# Patient Record
Sex: Female | Born: 1965 | Hispanic: No | Marital: Single | Smoking: Current every day smoker
Health system: Southern US, Community
[De-identification: ages and names within clinical notes are randomized; demographics above are authoritative.]

## PROBLEM LIST (undated history)

## (undated) DIAGNOSIS — I10 Essential (primary) hypertension: Secondary | ICD-10-CM

## (undated) DIAGNOSIS — Q249 Congenital malformation of heart, unspecified: Secondary | ICD-10-CM

## (undated) DIAGNOSIS — K2970 Gastritis, unspecified, without bleeding: Secondary | ICD-10-CM

## (undated) DIAGNOSIS — F419 Anxiety disorder, unspecified: Secondary | ICD-10-CM

## (undated) DIAGNOSIS — F32A Depression, unspecified: Secondary | ICD-10-CM

## (undated) DIAGNOSIS — F329 Major depressive disorder, single episode, unspecified: Secondary | ICD-10-CM

## (undated) DIAGNOSIS — Z5189 Encounter for other specified aftercare: Secondary | ICD-10-CM

## (undated) HISTORY — PX: CHOLECYSTECTOMY: SHX55

## (undated) HISTORY — PX: CARDIAC SURGERY: SHX584

---

## 2014-06-27 ENCOUNTER — Emergency Department (HOSPITAL_COMMUNITY): Payer: Self-pay

## 2014-06-27 ENCOUNTER — Emergency Department (HOSPITAL_COMMUNITY)
Admission: EM | Admit: 2014-06-27 | Discharge: 2014-06-28 | Disposition: A | Discharge status: Home / Self Care | Payer: Self-pay | Attending: Emergency Medicine | Admitting: Emergency Medicine

## 2014-06-27 ENCOUNTER — Encounter (HOSPITAL_COMMUNITY): Payer: Self-pay | Admitting: Emergency Medicine

## 2014-06-27 DIAGNOSIS — R531 Weakness: Secondary | ICD-10-CM | POA: Insufficient documentation

## 2014-06-27 DIAGNOSIS — R0789 Other chest pain: Secondary | ICD-10-CM | POA: Insufficient documentation

## 2014-06-27 DIAGNOSIS — Z3202 Encounter for pregnancy test, result negative: Secondary | ICD-10-CM | POA: Insufficient documentation

## 2014-06-27 DIAGNOSIS — R109 Unspecified abdominal pain: Secondary | ICD-10-CM | POA: Insufficient documentation

## 2014-06-27 DIAGNOSIS — R42 Dizziness and giddiness: Secondary | ICD-10-CM | POA: Insufficient documentation

## 2014-06-27 HISTORY — DX: Anxiety disorder, unspecified: F41.9

## 2014-06-27 HISTORY — DX: Gastritis, unspecified, without bleeding: K29.70

## 2014-06-27 HISTORY — DX: Congenital malformation of heart, unspecified: Q24.9

## 2014-06-27 HISTORY — DX: Encounter for other specified aftercare: Z51.89

## 2014-06-27 HISTORY — DX: Depression, unspecified: F32.A

## 2014-06-27 HISTORY — DX: Major depressive disorder, single episode, unspecified: F32.9

## 2014-06-27 HISTORY — DX: Essential (primary) hypertension: I10

## 2014-06-27 LAB — URINALYSIS, ROUTINE W REFLEX MICROSCOPIC
Bilirubin Urine: NEGATIVE
Glucose, UA: NEGATIVE mg/dL
Hgb urine dipstick: NEGATIVE
KETONES UR: NEGATIVE mg/dL
LEUKOCYTES UA: NEGATIVE
NITRITE: NEGATIVE
Protein, ur: NEGATIVE mg/dL
SPECIFIC GRAVITY, URINE: 1.003 — AB (ref 1.005–1.030)
UROBILINOGEN UA: 0.2 mg/dL (ref 0.0–1.0)
pH: 6 (ref 5.0–8.0)

## 2014-06-27 LAB — CBC WITH DIFFERENTIAL/PLATELET
BASOS ABS: 0 10*3/uL (ref 0.0–0.1)
Basophils Relative: 0 % (ref 0–1)
EOS PCT: 3 % (ref 0–5)
Eosinophils Absolute: 0.3 10*3/uL (ref 0.0–0.7)
HCT: 37.2 % (ref 36.0–46.0)
Hemoglobin: 12.2 g/dL (ref 12.0–15.0)
Lymphocytes Relative: 28 % (ref 12–46)
Lymphs Abs: 2.7 10*3/uL (ref 0.7–4.0)
MCH: 33.6 pg (ref 26.0–34.0)
MCHC: 32.8 g/dL (ref 30.0–36.0)
MCV: 102.5 fL — ABNORMAL HIGH (ref 78.0–100.0)
MONO ABS: 0.4 10*3/uL (ref 0.1–1.0)
MONOS PCT: 4 % (ref 3–12)
Neutro Abs: 6.3 10*3/uL (ref 1.7–7.7)
Neutrophils Relative %: 65 % (ref 43–77)
PLATELETS: 234 10*3/uL (ref 150–400)
RBC: 3.63 MIL/uL — ABNORMAL LOW (ref 3.87–5.11)
RDW: 15.8 % — ABNORMAL HIGH (ref 11.5–15.5)
WBC: 9.7 10*3/uL (ref 4.0–10.5)

## 2014-06-27 LAB — COMPREHENSIVE METABOLIC PANEL
ALT: 19 U/L (ref 14–54)
AST: 25 U/L (ref 15–41)
Albumin: 3.5 g/dL (ref 3.5–5.0)
Alkaline Phosphatase: 59 U/L (ref 38–126)
Anion gap: 12 (ref 5–15)
BILIRUBIN TOTAL: 0.2 mg/dL — AB (ref 0.3–1.2)
BUN: 13 mg/dL (ref 6–20)
CO2: 21 mmol/L — AB (ref 22–32)
Calcium: 8.9 mg/dL (ref 8.9–10.3)
Chloride: 108 mmol/L (ref 101–111)
Creatinine, Ser: 0.69 mg/dL (ref 0.44–1.00)
GFR calc Af Amer: >60 mL/min (ref >60)
GFR calc non Af Amer: >60 mL/min (ref >60)
Glucose, Bld: 87 mg/dL (ref 65–99)
Potassium: 3.6 mmol/L (ref 3.5–5.1)
Sodium: 141 mmol/L (ref 135–145)
TOTAL PROTEIN: 6.4 g/dL — AB (ref 6.5–8.1)

## 2014-06-27 LAB — PREGNANCY, URINE: Preg Test, Ur: NEGATIVE

## 2014-06-27 LAB — TROPONIN I: Troponin I: <0.03 ng/mL (ref <0.031)

## 2014-06-27 MED ORDER — SODIUM CHLORIDE 0.9 % IV BOLUS (SEPSIS)
1000.0000 mL | Freq: Once | INTRAVENOUS | Status: AC
  Administered 2014-06-27: 1000 mL via INTRAVENOUS

## 2014-06-27 NOTE — ED Notes (Signed)
Pt arrives from [facility] via [facility], reports 2 episodes of near syncope today, denies LOC, denies fall, denies hitting head.  Pt reports starting lisinopril-HTCZ recently, reports  LLE edema since beginning meds.  Pt also reports new meds of gabapentin and meloxicam.  Pt reports taking "1-2 shots" today before near syncope.  AOx4, resp e/u.

## 2014-06-27 NOTE — ED Notes (Signed)
Patient transported to X-ray 

## 2014-06-27 NOTE — ED Provider Notes (Signed)
CSN: [id number]     Arrival date & time 06/27/14  2038 History   First MD Initiated Contact with Patient 06/27/14 2046     Chief Complaint  Patient presents with  . Near Syncope  . Leg Swelling     (Consider location/radiation/quality/duration/timing/severity/associated sxs/prior Treatment) Patient is a 49 y.o. female presenting with near-syncope. The history is provided by the patient and a significant other. No language interpreter was used.  Near Syncope This is a new problem. The current episode started today. Associated symptoms include abdominal pain. Pertinent negatives include no chills, fever, vomiting or weakness. Associated symptoms comments: Two episodes of lightheadedness and chest pain today lasting about 10 minutes each, separated by 6 hours. Currently asymptomatic. She states she ate breakfast, left the shelter and [place] she had the first episode around noon. The second was around 6:00 pm. No full syncope. She states she was recently started on 3 new medications (HCTZ, gabapentin and Mobic). She has noticed increased lower extremity swelling. No SOB, nausea, vomiting. She has intermittent abdominal pain she refers to as "gastritis" that is a chronic problem. .    No past medical history on file. No past surgical history on file. No family history on file. History  Substance Use Topics  . Smoking status: Not on file  . Smokeless tobacco: Not on file  . Alcohol Use: Not on file   OB History    No data available     Review of Systems  Constitutional: Negative for fever and chills.  HENT: Negative.   Respiratory: Positive for chest tightness. Negative for shortness of breath.   Cardiovascular: Positive for near-syncope.  Gastrointestinal: Positive for abdominal pain. Negative for vomiting and diarrhea.  Musculoskeletal: Negative.   Skin: Negative.   Neurological: Positive for light-headedness. Negative for syncope and weakness.       Allergies  Review of patient's allergies indicates not on file.  Home Medications   Prior to Admission medications   Not on File   BP 106/68 mmHg  Pulse 82  Temp(Src) 98.3 F (36.8 C) (Oral)  Resp 19  Ht 5' 3.5" (1.613 m)  Wt 152 lb (68.947 kg)  BMI 26.50 kg/m2  SpO2 97% Physical Exam  Constitutional: She is oriented to person, place, and time. She appears well-developed and well-nourished. No distress.  HENT:  Head: Normocephalic.  Widespread dental decay.   Neck: Normal range of motion. Neck supple.  Cardiovascular: Normal rate and regular rhythm.   Pulmonary/Chest: Effort normal and breath sounds normal. No respiratory distress.  Abdominal: Soft. Bowel sounds are normal. There is no tenderness. There is no rebound and no guarding.  Musculoskeletal: Normal range of motion. She exhibits no edema.  Neurological: She is alert and oriented to person, place, and time.  Skin: Skin is warm and dry. No rash noted.  Psychiatric: She has a normal mood and affect.    ED Course  Procedures (including critical care time) Labs Review Labs Reviewed - No data to display  Imaging Review No results found.   EKG Interpretation None     Results for orders placed or performed during the hospital encounter of 06/27/14  Comprehensive metabolic panel  Result Value Ref Range   Sodium 141 135 - 145 mmol/L   Potassium 3.6 3.5 - 5.1 mmol/L   Chloride 108 101 - 111 mmol/L   CO2 21 (L) 22 - 32 mmol/L   Glucose, Bld 87 65 - 99 mg/dL   BUN 13  6 - 20 mg/dL   Creatinine, Ser 1.61 0.44 - 1.00 mg/dL   Calcium 8.9 8.9 - 09.6 mg/dL   Total Protein 6.4 (L) 6.5 - 8.1 g/dL   Albumin 3.5 3.5 - 5.0 g/dL   AST 25 15 - 41 U/L   ALT 19 14 - 54 U/L   Alkaline Phosphatase 59 38 - 126 U/L   Total Bilirubin 0.2 (L) 0.3 - 1.2 mg/dL   GFR calc non Af Amer >60 >60 mL/min   GFR calc Af Amer >60 >60 mL/min   Anion gap 12 5 - 15  CBC with Differential  Result Value Ref Range   WBC 9.7 4.0 -  10.5 K/uL   RBC 3.63 (L) 3.87 - 5.11 MIL/uL   Hemoglobin 12.2 12.0 - 15.0 g/dL   HCT 04.5 40.9 - 81.1 %   MCV 102.5 (H) 78.0 - 100.0 fL   MCH 33.6 26.0 - 34.0 pg   MCHC 32.8 30.0 - 36.0 g/dL   RDW 91.4 (H) 78.2 - 95.6 %   Platelets 234 150 - 400 K/uL   Neutrophils Relative % 65 43 - 77 %   Neutro Abs 6.3 1.7 - 7.7 K/uL   Lymphocytes Relative 28 12 - 46 %   Lymphs Abs 2.7 0.7 - 4.0 K/uL   Monocytes Relative 4 3 - 12 %   Monocytes Absolute 0.4 0.1 - 1.0 K/uL   Eosinophils Relative 3 0 - 5 %   Eosinophils Absolute 0.3 0.0 - 0.7 K/uL   Basophils Relative 0 0 - 1 %   Basophils Absolute 0.0 0.0 - 0.1 K/uL  Troponin I  Result Value Ref Range   Troponin I <0.03 <0.031 ng/mL  Urinalysis, Routine w reflex microscopic  Result Value Ref Range   Color, Urine YELLOW YELLOW   APPearance CLEAR CLEAR   Specific Gravity, Urine 1.003 (L) 1.005 - 1.030   pH 6.0 5.0 - 8.0   Glucose, UA NEGATIVE NEGATIVE mg/dL   Hgb urine dipstick NEGATIVE NEGATIVE   Bilirubin Urine NEGATIVE NEGATIVE   Ketones, ur NEGATIVE NEGATIVE mg/dL   Protein, ur NEGATIVE NEGATIVE mg/dL   Urobilinogen, UA 0.2 0.0 - 1.0 mg/dL   Nitrite NEGATIVE NEGATIVE   Leukocytes, UA NEGATIVE NEGATIVE  Pregnancy, urine  Result Value Ref Range   Preg Test, Ur NEGATIVE NEGATIVE   Dg Chest 2 View  06/27/2014   CLINICAL DATA:  Weakness and bilateral lower extremity swelling for the past 3 days  EXAM: CHEST  2 VIEW  COMPARISON:  None.  FINDINGS: Normal cardiac silhouette and mediastinal contours. The lungs appear hyperexpanded with diffuse slightly nodular thickening of the pulmonary interstitium no discrete focal airspace opacities. No pleural effusion or pneumothorax. There is minimal pleural parenchymal thickening about the right minor fissure. No evidence of edema. No acute osseous abnormalities. Post cholecystectomy. A surgical clip overlies the medial aspect of the right breast.  IMPRESSION: Mild lung hyperexpansion and bronchitic change  without acute cardiopulmonary disease.   Electronically Signed   By: Simonne Come M.D.   On: 06/27/2014 22:32    MDM   Final diagnoses:  None    1. Near syncope  Labs and imaging are normal. Blood pressure is consistently low throughout ED evaluation. She has been ambulatory without recurrent symptoms and reports she feels well. Advised to stop taking her blood pressure medication (newly prescribed) until seen by the prescribing physician for further evaluation and management.     Elpidio Anis, PA-C 06/28/14 0542  Loraine Leriche  Fayrene FearingJames, MD 07/05/14 1335

## 2014-06-28 MED ORDER — SODIUM CHLORIDE 0.9 % IV BOLUS (SEPSIS)
1000.0000 mL | Freq: Once | INTRAVENOUS | Status: AC
  Administered 2014-06-28: 1000 mL via INTRAVENOUS

## 2014-06-28 NOTE — Discharge Instructions (Signed)
See your doctor this week for recheck. Do not take any more blood pressure medication until re-evaluated by your doctor.

## 2014-07-26 ENCOUNTER — Encounter (HOSPITAL_COMMUNITY): Payer: Self-pay | Admitting: Emergency Medicine

## 2014-07-26 ENCOUNTER — Emergency Department (HOSPITAL_COMMUNITY)
Admission: EM | Admit: 2014-07-26 | Discharge: 2014-07-27 | Disposition: A | Discharge status: Home / Self Care | Payer: Self-pay | Attending: Emergency Medicine | Admitting: Emergency Medicine

## 2014-07-26 ENCOUNTER — Emergency Department (HOSPITAL_COMMUNITY): Payer: Self-pay

## 2014-07-26 DIAGNOSIS — I10 Essential (primary) hypertension: Secondary | ICD-10-CM | POA: Insufficient documentation

## 2014-07-26 DIAGNOSIS — Z791 Long term (current) use of non-steroidal anti-inflammatories (NSAID): Secondary | ICD-10-CM | POA: Insufficient documentation

## 2014-07-26 DIAGNOSIS — F329 Major depressive disorder, single episode, unspecified: Secondary | ICD-10-CM | POA: Insufficient documentation

## 2014-07-26 DIAGNOSIS — R1013 Epigastric pain: Secondary | ICD-10-CM | POA: Insufficient documentation

## 2014-07-26 DIAGNOSIS — F419 Anxiety disorder, unspecified: Secondary | ICD-10-CM | POA: Insufficient documentation

## 2014-07-26 DIAGNOSIS — R569 Unspecified convulsions: Secondary | ICD-10-CM | POA: Insufficient documentation

## 2014-07-26 DIAGNOSIS — Z8719 Personal history of other diseases of the digestive system: Secondary | ICD-10-CM | POA: Insufficient documentation

## 2014-07-26 DIAGNOSIS — Z720 Tobacco use: Secondary | ICD-10-CM | POA: Insufficient documentation

## 2014-07-26 DIAGNOSIS — Z79899 Other long term (current) drug therapy: Secondary | ICD-10-CM | POA: Insufficient documentation

## 2014-07-26 DIAGNOSIS — R4020 Unspecified coma: Secondary | ICD-10-CM

## 2014-07-26 LAB — COMPREHENSIVE METABOLIC PANEL
ALBUMIN: 3.5 g/dL (ref 3.5–5.0)
ALT: 14 U/L (ref 14–54)
AST: 30 U/L (ref 15–41)
Alkaline Phosphatase: 56 U/L (ref 38–126)
Anion gap: 7 (ref 5–15)
BUN: 8 mg/dL (ref 6–20)
CALCIUM: 8 mg/dL — AB (ref 8.9–10.3)
CO2: 23 mmol/L (ref 22–32)
Chloride: 113 mmol/L — ABNORMAL HIGH (ref 101–111)
Creatinine, Ser: 0.74 mg/dL (ref 0.44–1.00)
GFR calc Af Amer: >60 mL/min (ref >60)
Glucose, Bld: 90 mg/dL (ref 65–99)
Potassium: 3.8 mmol/L (ref 3.5–5.1)
SODIUM: 143 mmol/L (ref 135–145)
Total Bilirubin: 0.3 mg/dL (ref 0.3–1.2)
Total Protein: 6.1 g/dL — ABNORMAL LOW (ref 6.5–8.1)

## 2014-07-26 LAB — I-STAT TROPONIN, ED: Troponin i, poc: 0 ng/mL (ref 0.00–0.08)

## 2014-07-26 LAB — CBC WITH DIFFERENTIAL/PLATELET
Basophils Absolute: 0 10*3/uL (ref 0.0–0.1)
Basophils Relative: 0 % (ref 0–1)
Eosinophils Absolute: 0.2 10*3/uL (ref 0.0–0.7)
Eosinophils Relative: 3 % (ref 0–5)
HCT: 35.4 % — ABNORMAL LOW (ref 36.0–46.0)
HEMOGLOBIN: 11.6 g/dL — AB (ref 12.0–15.0)
LYMPHS ABS: 2.3 10*3/uL (ref 0.7–4.0)
Lymphocytes Relative: 30 % (ref 12–46)
MCH: 33 pg (ref 26.0–34.0)
MCHC: 32.8 g/dL (ref 30.0–36.0)
MCV: 100.9 fL — ABNORMAL HIGH (ref 78.0–100.0)
MONOS PCT: 5 % (ref 3–12)
Monocytes Absolute: 0.4 10*3/uL (ref 0.1–1.0)
NEUTROS PCT: 62 % (ref 43–77)
Neutro Abs: 4.7 10*3/uL (ref 1.7–7.7)
Platelets: 263 10*3/uL (ref 150–400)
RBC: 3.51 MIL/uL — AB (ref 3.87–5.11)
RDW: 15.3 % (ref 11.5–15.5)
WBC: 7.6 10*3/uL (ref 4.0–10.5)

## 2014-07-26 LAB — LIPASE, BLOOD: Lipase: 30 U/L (ref 22–51)

## 2014-07-26 NOTE — ED Notes (Signed)
EMS stated that patient reported a syncopal episode. Upon EMS arrival, pt was confused and unable to answer questions appropriately. Enroute to the hospital, pt started to complain of chest pain, that was relieved with nitroglycerin. Upon arrival to the patient's room, patient was CAO, answering questions appropriately, and chest pain free.

## 2014-07-26 NOTE — ED Provider Notes (Signed)
CSN: [id number]     Arrival date & time 07/26/14  2143 History   First MD Initiated Contact with Patient 07/26/14 2200     Chief Complaint  Patient presents with  . Near Syncope     (Consider location/radiation/quality/duration/timing/severity/associated sxs/prior Treatment) Patient is a 49 y.o. female presenting with near-syncope and abdominal pain. The history is provided by the patient and the spouse. No language interpreter was used.  Near Syncope This is a new problem. The current episode started 1 to 2 hours ago. The problem occurs rarely. The problem has been resolved. Associated symptoms include abdominal pain. Pertinent negatives include no chest pain, no headaches and no shortness of breath. Nothing aggravates the symptoms. Nothing relieves the symptoms. She has tried nothing for the symptoms. The treatment provided no relief.  Abdominal Pain Pain location:  Epigastric Pain quality: sharp   Pain radiates to:  Does not radiate Pain severity:  Moderate Onset quality:  Sudden Duration: several hours before syncopal episode. Timing:  Intermittent Progression:  Resolved Chronicity:  New Relieved by:  Nothing Worsened by:  Nothing tried Ineffective treatments:  None tried Associated symptoms: anorexia   Associated symptoms: no chest pain, no chills, no cough, no diarrhea, no dysuria, no fatigue, no fever, no nausea, no shortness of breath, no sore throat and no vomiting   Associated symptoms comment:  Insomnia    Past Medical History  Diagnosis Date  . Hypertension   . Heart abnormality     repiared 1996  . Gastritis   . Blood transfusion without reported diagnosis   . Depression   . Anxiety    Past Surgical History  Procedure Laterality Date  . Cardiac surgery    . Cholecystectomy     History reviewed. No pertinent family history. History  Substance Use Topics  . Smoking status: Current Every Day Smoker -- 1.00 packs/day    Types: Cigarettes  . Smokeless  tobacco: Never Used  . Alcohol Use: Yes     Comment: "special occasion"   OB History    No data available     Review of Systems  Constitutional: Negative for fever, chills, diaphoresis, activity change, appetite change and fatigue.  HENT: Negative for congestion, facial swelling, rhinorrhea and sore throat.   Eyes: Negative for photophobia and discharge.  Respiratory: Negative for cough, chest tightness and shortness of breath.   Cardiovascular: Positive for near-syncope. Negative for chest pain, palpitations and leg swelling.  Gastrointestinal: Positive for abdominal pain and anorexia. Negative for nausea, vomiting and diarrhea.  Endocrine: Negative for polydipsia and polyuria.  Genitourinary: Negative for dysuria, frequency, difficulty urinating and pelvic pain.  Musculoskeletal: Negative for back pain, arthralgias, neck pain and neck stiffness.  Skin: Negative for color change and wound.  Allergic/Immunologic: Negative for immunocompromised state.  Neurological: Positive for syncope. Negative for facial asymmetry, weakness, numbness and headaches.  Hematological: Does not bruise/bleed easily.  Psychiatric/Behavioral: Negative for confusion and agitation.      Allergies  Trazamine  Home Medications   Prior to Admission medications   Medication Sig Start Date End Date Taking? Authorizing Provider  gabapentin (NEURONTIN) 100 MG capsule Take 100 mg by mouth at bedtime.    Historical Provider, MD  lisinopril-hydrochlorothiazide (PRINZIDE,ZESTORETIC) 20-25 MG per tablet Take 0.5 tablets by mouth daily.    Historical Provider, MD  meloxicam (MOBIC) 15 MG tablet Take 15 mg by mouth daily.    Historical Provider, MD   BP 123/75 mmHg  Pulse 88  Temp(Src) 97.7 F (36.5  C) (Oral)  Resp 16  SpO2 98%  LMP 04/16/2014 (LMP Unknown) Physical Exam  Constitutional: She is oriented to person, place, and time. She appears well-developed and well-nourished. No distress.  HENT:  Head:  Normocephalic and atraumatic.  Mouth/Throat: No oropharyngeal exudate.  Eyes: Pupils are equal, round, and reactive to light.  Neck: Normal range of motion. Neck supple.  Cardiovascular: Normal rate, regular rhythm and normal heart sounds.  Exam reveals no gallop and no friction rub.   No murmur heard. Pulmonary/Chest: Effort normal and breath sounds normal. No respiratory distress. She has no wheezes. She has no rales.  Abdominal: Soft. Bowel sounds are normal. She exhibits no distension and no mass. There is tenderness in the epigastric area. There is no rebound and no guarding.  Musculoskeletal: Normal range of motion. She exhibits no edema or tenderness.  Neurological: She is alert and oriented to person, place, and time.  Skin: Skin is warm and dry.  Psychiatric: She has a normal mood and affect.    ED Course  Procedures (including critical care time) Labs Review Labs Reviewed  CBC WITH DIFFERENTIAL/PLATELET - Abnormal; Notable for the following:    RBC 3.51 (*)    Hemoglobin 11.6 (*)    HCT 35.4 (*)    MCV 100.9 (*)    All other components within normal limits  COMPREHENSIVE METABOLIC PANEL - Abnormal; Notable for the following:    Chloride 113 (*)    Calcium 8.0 (*)    Total Protein 6.1 (*)    All other components within normal limits  LIPASE, BLOOD  I-STAT TROPOININ, ED    Imaging Review Dg Chest 2 View  07/26/2014   CLINICAL DATA:  Syncope, chest pain, cough  EXAM: CHEST  2 VIEW  COMPARISON:  06/27/2014  FINDINGS: Lungs are clear.  No pleural effusion or pneumothorax.  The heart is normal in size.  Visualized osseous structures are within normal limits.  IMPRESSION: Normal chest radiographs.   Electronically Signed   By: Charline Bills M.D.   On: 07/26/2014 23:36   Ct Head Wo Contrast  07/27/2014   CLINICAL DATA:  Syncope, dizziness  EXAM: CT HEAD WITHOUT CONTRAST  TECHNIQUE: Contiguous axial images were obtained from the base of the skull through the vertex without  intravenous contrast.  COMPARISON:  None.  FINDINGS: No evidence of parenchymal hemorrhage or extra-axial fluid collection. No mass lesion, mass effect, or midline shift.  No CT evidence of acute infarction.  Cerebral volume is within normal limits.  No ventriculomegaly.  The visualized paranasal sinuses are essentially clear. The mastoid air cells are unopacified.  No evidence of calvarial fracture.  IMPRESSION: No evidence of acute intracranial abnormality.   Electronically Signed   By: Charline Bills M.D.   On: 07/27/2014 00:04     EKG Interpretation   Date/Time:  Friday July 26 2014 21:50:54 EDT Ventricular Rate:  87 PR Interval:  137 QRS Duration: 82 QT Interval:  389 QTC Calculation: 468 R Axis:   59 Text Interpretation:  Sinus rhythm Probable left atrial enlargement Low  voltage, precordial leads Probable anteroseptal infarct, old No  significant change since last tracing Confirmed by DOCHERTY  MD, MEGAN  [phone]) on 07/26/2014 9:53:58 PM      MDM   Final diagnoses:  LOC (loss of consciousness)  Seizure   Pt is a 49 y.o. female with Pmhx as above who presents with acute LOC, per patient's husband, they're homeless and has been sleeping outside for about  the past week.  They had been staying in a shelter today, however, they were kicked out this evening.  The patient was walking around the corner when she had a sudden LOC.  Husband states that her bilateral arms were shaking and that she was limp and appeared "out of it", had several dry heaves without vomiting.  After shaking subsided, which lasted approximately 15 minutes, she was somewhat confused.  She states that she had several hours of sharp epigastric pain earlier today without radiation, without associated symptoms.  Husband reports she has been very stressed and has not slept in at least one week.  On physical exam, vital signs are stable and she is in no acute distress.  She has no signs of acute trauma on physical exam,  her neurologic exam as well as her cardiopulmonary exam and abdominal exams are benign.  EKG has no acute changes.  History of LOC with shaking and confusion.  Sounds more like seizures and a syncopal episode.  This patient has no known history of such, i will get a CT head.  As well as basic labs, electrolytes, troponin.     CT head normal.  Chest x-ray normal.  Troponin not elevated.  CBC and CMP are grossly unremarkable, as is lipase.  "Chest pain", appeared to be more of an epigastric pain, doubt ACS.  Episode of loss of consciousness appears to be more likely a seizure, than a syncopal episode.  Most likely triggered by lack of sleep. Tp given outpt resources for food pantries, shelters, and f/u with [facility].    Denny Levy evaluation in the Emergency Department is complete. It has been determined that no acute conditions requiring further emergency intervention are present at this time. The patient/guardian have been advised of the diagnosis and plan. We have discussed signs and symptoms that warrant return to the ED, such as changes or worsening in symptoms, continued episodes of loss of consciousness or abnormal movements, fevers, inability to tolerate liquids.       Toy Cookey, MD 07/27/14 1115

## 2014-07-27 NOTE — Discharge Instructions (Signed)
Seizure, Adult °A seizure is abnormal electrical activity in the brain. Seizures usually last from 30 seconds to 2 minutes. There are various types of seizures. °Before a seizure, you may have a warning sensation (aura) that a seizure is about to occur. An aura may include the following symptoms:  °· Fear or anxiety. °· Nausea. °· Feeling like the room is spinning (vertigo). °· Vision changes, such as seeing flashing lights or spots. °Common symptoms during a seizure include: °· A change in attention or behavior (altered mental status). °· Convulsions with rhythmic jerking movements. °· Drooling. °· Rapid eye movements. °· Grunting. °· Loss of bladder and bowel control. °· Bitter taste in the mouth. °· Tongue biting. °After a seizure, you may feel confused and sleepy. You may also have an injury resulting from convulsions during the seizure. °HOME CARE INSTRUCTIONS  °· If you are given medicines, take them exactly as prescribed by your health care provider. °· Keep all follow-up appointments as directed by your health care provider. °· Do not swim or drive or engage in risky activity during which a seizure could cause further injury to you or others until your health care provider says it is OK. °· Get adequate rest. °· Teach friends and family what to do if you have a seizure. They should: °¨ Lay you on the ground to prevent a fall. °¨ Put a cushion under your head. °¨ Loosen any tight clothing around your neck. °¨ Turn you on your side. If vomiting occurs, this helps keep your airway clear. °¨ Stay with you until you recover. °¨ Know whether or not you need emergency care. °SEEK IMMEDIATE MEDICAL CARE IF: °· The seizure lasts longer than 5 minutes. °· The seizure is severe or you do not wake up immediately after the seizure. °· You have an altered mental status after the seizure. °· You are having more frequent or worsening seizures. °Someone should drive you to the emergency department or call local emergency  services (911 in [place] °MAKE SURE YOU: °· Understand these instructions. °· Will watch your condition. °· Will get help right away if you are not doing well or get worse. °Document Released: 01/16/2000 Document Revised: 11/08/2012 Document Reviewed: 08/30/2012 °ExitCare® Patient Information ©2015 [employer]. This information is not intended to replace advice given to you by your health care provider. Make sure you discuss any questions you have with your health care provider. ° °Emergency Department Resource Guide °1) Find a Doctor and Pay Out of Pocket °Although you won't have to find out who is covered by your insurance plan, it is a good idea to ask around and get recommendations. You will then need to call the office and see if the doctor you have chosen will accept you as a new patient and what types of options they offer for patients who are self-pay. Some doctors offer discounts or will set up payment plans for their patients who do not have insurance, but you will need to ask so you aren't surprised when you get to your appointment. ° °2) Contact Your Local Health Department °Not all health departments have doctors that can see patients for sick visits, but many do, so it is worth a call to see if yours does. If you don't know where your local health department is, you can check in your phone book. The CDC also has a tool to help you locate your state's health department, and many state websites also have listings of all of their   local health departments. ° °3) Find a Walk-in Clinic °If your illness is not likely to be very severe or complicated, you may want to try a walk in clinic. These are popping up all over the country in pharmacies, drugstores, and shopping centers. They're usually staffed by nurse practitioners or physician assistants that have been trained to treat common illnesses and complaints. They're usually fairly quick and inexpensive. However, if you have serious medical issues or  chronic medical problems, these are probably not your best option. ° °No Primary Care Doctor: °- Call [facility] at  832-8000 - they can help you locate a primary care doctor that  accepts your insurance, provides certain services, etc. °- Physician Referral Service- 1-800-533-3463 ° °Chronic Pain Problems: °Organization         Address  Phone   Notes  °[facility]  (336) 297-2271 Patients need to be referred by their primary care doctor.  ° °Medication Assistance: °Organization         Address  Phone   Notes  °[place] [facility] [address] °[place], [place] [address] (336) 641-8030 --Must be a resident of [place] °-- Must have NO insurance coverage whatsoever (no Medicaid/ Medicare, etc.) °-- The pt. MUST have a primary care doctor that directs their care regularly and follows them in the community °  °[facility]  (866) 331-1348   °[employer]  (888) 892-1162   ° °Agencies that provide inexpensive medical care: °Organization         Address  Phone   Notes  °[facility]  (336) 832-8035   °[facility]    (336) 832-7272   °[facility] [address] °[place], [place] [address] (336) 832-4777   °[facility] [address], °[place] (336) 271-4999   °[facility]    (336) 373-0678   °[facility]    (336) 272-1050   °[facility] ° [address], [place] Phone:  (336) 832-4444, Fax:  (336) 832-4440 Hours of Operation:  9 am - 6 pm, M-F.  Also accepts Medicaid/Medicare and self-pay.  °[facility] ° [address], [place] Phone: (336) 832-3150, Fax: (336) 832-3151. Hours of Operation:  8:30 am - 5:30 pm, M-F.  Also accepts Medicaid and self-pay.  °[facility] [place] [address], [place] Phone: (336) 878-6027   °[facility] [address], [place], [place]  (336)723-1848, Ext. 123 Mondays & Thursdays: 7-9 AM.  First 15 patients are seen on a first come, first serve basis. °  ° °Medicaid-accepting [place] Providers: ° °Organization         Address  Phone   Notes  °[facility] [address], [place] (336) 641-2100 Also accepts self-pay patients.  °[facility] [address], [place] ° (336) 856-9996   °[facility] [address], [place] (336) 288-8857   °[facility] [address] [place] [address], [place] (336) 299-7000   °Veita Bland [address], [place]  ° (336) 373-1557 Only accepts [place] Access Medicaid patients after they have their name applied to their card.  ° °Self-Pay (no insurance) in [place]: ° °Organization         Address  Phone   Notes  °Sickle Cell Patients,   [facility] [address], [place] (336) 832-1970   °[facility] [address], [place] (336) 832-4400   °[facility] [place] [facility] ° [address], [place] (336) 992-4800   °[facility]/Dr. Osei-Bonsu ° [address] [place] [address], [place] or [address], [place] (336) 841-8500 Phone number for both [place] and [place] locations is the same.  °[facility] [address], [place] (336) 299-0000   °[facility] [address] [place] [address], [place] or [address] (336) 852-7530 °(336) 878-2260   °[facility] [address], [place] (336) 350-1642, phone; (336) 294-5005, fax Sees patients 1st and 3rd Saturday of every month.  Must not qualify for public or private insurance (i.e. Medicaid, Medicare, [place] Health Choice, Veterans' Benefits) • Household income should be no more than 200% of the poverty level •The clinic cannot treat you if you are pregnant or think you are pregnant • Sexually transmitted  diseases are not treated at the clinic.  ° ° °Dental Care: °Organization         Address  Phone  Notes  °[facility] [facility] [address], [place] (336) 641-6152 Accepts children up to age 21 who are enrolled in Medicaid or [place] Health Choice; pregnant women with a Medicaid card; and children who have applied for Medicaid or [place] Health Choice, but were declined, whose parents can pay a reduced fee at time of service.  °[facility] [place]  [address], [place] (336) 641-7733 Accepts children up to age 21 who are enrolled in Medicaid or [place] Health Choice; pregnant women with a Medicaid card; and children who have applied for Medicaid or [place] Health Choice, but were declined, whose parents can pay a reduced fee at time of service.  °[facility] ° [address], [place] (336) 641-4533 Patients are seen by appointment only. Walk-ins are not accepted. [facility] will see patients 18 years of age and older. °Monday - Tuesday (8am-5pm) °Most Wednesdays (8:30-5pm) °$30 per visit, cash only  °[facility] ° [address], [place] (336) 641-4533 Patients are seen by appointment only. Walk-ins are not accepted. [facility] will see patients 18 years of age and older. °One Wednesday Evening (Monthly: Volunteer Based).  $30 per visit, cash only  °[facility]  (919) 537-3737 for adults; Children under age 4, call Graduate Pediatric Dentistry at (919) [phone]. Children aged 4-14, please call (919) [phone] to request a pediatric application. ° Dental services are provided in all areas of dental care including fillings, crowns and bridges, complete and partial dentures, implants, gum treatment, root canals, and extractions. Preventive care is also provided. Treatment is provided to both adults and children. °Patients are selected via a  lottery and there is often a waiting list. °  °[facility] [address], ° ° (336) 763-8833 [email or web address].[email or web address] °  °[facility] [address], [place], [place] (336)723-1848, Ext. 123 Second and Fourth Thursday of each month, opens at 6:30 AM; Clinic ends at 9 AM.  Patients are seen on a first-come first-served basis, and a limited number are seen during each clinic.  ° °[facility] ° [address], [place], [place] (336) 723-7904   Eligibility Requirements °You must   have lived in [place], or [place] counties for at least the last three months. °  You cannot be eligible for state or federal sponsored healthcare insurance, including Veterans Administration, Medicaid, or Medicare. °  You generally cannot be eligible for healthcare insurance through your employer.  °  How to apply: °Eligibility screenings are held every Tuesday and Wednesday afternoon from 1:00 pm until 4:00 pm. You do not need an appointment for the interview!  °[facility] [address], [place], [place] 336-631-2330   °[facility]  336-342-8273   °[facility]  336-703-3100   °[facility]  336-570-6415   ° °Behavioral Health Resources in the Community: °Intensive Outpatient Programs °Organization         Address  Phone  Notes  °[facility] [address], [place], [place] 336-878-6098   °[facility] [address], [place], [place] 336-832-9800   °[facility] [address], [place], [place] ° 336-882-2125   °[facility] [address],  °[place], [place] 1-800-853-5163 or 336-641-4981   °Substance Abuse Resources °Organization         Address  Phone  Notes  °[facility]  336-882-2125   °[facility]  336-784-9470   °[facility]  336-285-9073   °[facility]  336-845-3988   °Residential &  Outpatient Substance Abuse Program  1-800-659-3381   °Psychological Services °Organization         Address  Phone  Notes  °[facility]  336- 832-9600   °[facility]  336- 378-7881   °[facility] [address], [place] 1-800-853-5163 or 336-641-4981   ° °Mobile Crisis Teams °Organization         Address  Phone  Notes  °[facility], Mobile Crisis Care Unit  1-877-626-1772   °Assertive °[facility] ° [address] [place], [place] 336-834-9664   °Sharon DeEsch [address] °[place] 336-554-5454   ° °Self-Help/Support Groups °Organization         Address  Phone             Notes  °[facility] [place] - variety of support groups  336- 373-1402 Call for more information  °Narcotics Anonymous (NA), [facility] [address], °[place]  2 meetings at this location  ° °Residential Treatment Programs °Organization         Address  Phone  Notes  °[facility] [address],    °[place]  1-866-801-8205   °[facility] ° [address], [place], [place] 704-293-8524   °[facility] [address], [place] 336-845-3988 Admissions: 8am-3pm M-F  °[facility] [address]    °[place], [place] 336-841-1104   °[facility] [address], [place] 336-379-7146   °[facility] [address]  °[place], [place] 336-285-9073   °[facility] Intensive Outpatient [address], [place], [place] 336-852-3033   °[facility] [address]  °[place], [place] 1-877-615-2722 or 336-784-9470   °[facility] [address] [place], [place] 336-227-7417 Accepts Medicaid  °[facility] [address]  °[place]  1-800-659-3381 Substance Abuse/Addiction Treatment  ° °[place] Behavioral Health Resources °Organization          Address    Phone  Notes  °[facility]  (888) 581-9988   °Julie Brannon, PhD [address] [place], [place]   (336) 349-5553 or (336) 951-0000   °[facility]   [address] °[place], [place] (336) 349-4454   °[facility] [address], [place], [place] (336) 342-8316 Insurance/Medicaid/sponsorship through [facility]  °[facility] [address]                                    [place], [place] (336) 342-8316 Therapy/tele-psych/case  °[facility] [address]  ° [place], [place] (336) 349-2233    °Dr. Arfeen  (336) 349-4544   °[facility] [place]  [employer] [place] [facility] 1) [address], [place] °2) [address], [place] °3)  [address], [place] (336) 349-3220 °(336) 342-7768 ° °(336) 342-8140   °[place] [facility] (336) 342-1394 or (336) 342-3537 (After Hours)    ° ° °

## 2014-07-27 NOTE — ED Notes (Signed)
Pt left with all belongings and refused wheelchair. 

## 2014-12-01 ENCOUNTER — Emergency Department (HOSPITAL_COMMUNITY)
Admission: EM | Admit: 2014-12-01 | Discharge: 2014-12-02 | Disposition: A | Discharge status: Home / Self Care | Payer: Self-pay | Attending: Emergency Medicine | Admitting: Emergency Medicine

## 2014-12-01 ENCOUNTER — Encounter (HOSPITAL_COMMUNITY): Payer: Self-pay | Admitting: Oncology

## 2014-12-01 ENCOUNTER — Emergency Department (HOSPITAL_COMMUNITY): Payer: Self-pay

## 2014-12-01 DIAGNOSIS — Z720 Tobacco use: Secondary | ICD-10-CM | POA: Insufficient documentation

## 2014-12-01 DIAGNOSIS — Z79899 Other long term (current) drug therapy: Secondary | ICD-10-CM | POA: Insufficient documentation

## 2014-12-01 DIAGNOSIS — M545 Low back pain: Secondary | ICD-10-CM

## 2014-12-01 DIAGNOSIS — Z3202 Encounter for pregnancy test, result negative: Secondary | ICD-10-CM | POA: Insufficient documentation

## 2014-12-01 DIAGNOSIS — F419 Anxiety disorder, unspecified: Secondary | ICD-10-CM | POA: Insufficient documentation

## 2014-12-01 DIAGNOSIS — M5137 Other intervertebral disc degeneration, lumbosacral region: Secondary | ICD-10-CM | POA: Insufficient documentation

## 2014-12-01 DIAGNOSIS — Z8774 Personal history of (corrected) congenital malformations of heart and circulatory system: Secondary | ICD-10-CM | POA: Insufficient documentation

## 2014-12-01 DIAGNOSIS — Z8719 Personal history of other diseases of the digestive system: Secondary | ICD-10-CM | POA: Insufficient documentation

## 2014-12-01 DIAGNOSIS — F329 Major depressive disorder, single episode, unspecified: Secondary | ICD-10-CM | POA: Insufficient documentation

## 2014-12-01 DIAGNOSIS — I10 Essential (primary) hypertension: Secondary | ICD-10-CM | POA: Insufficient documentation

## 2014-12-01 LAB — URINALYSIS, ROUTINE W REFLEX MICROSCOPIC
BILIRUBIN URINE: NEGATIVE
GLUCOSE, UA: NEGATIVE mg/dL
Hgb urine dipstick: NEGATIVE
KETONES UR: NEGATIVE mg/dL
Leukocytes, UA: NEGATIVE
Nitrite: NEGATIVE
PROTEIN: NEGATIVE mg/dL
Specific Gravity, Urine: 1.012 (ref 1.005–1.030)
UROBILINOGEN UA: 0.2 mg/dL (ref 0.0–1.0)
pH: 5 (ref 5.0–8.0)

## 2014-12-01 LAB — POC URINE PREG, ED: PREG TEST UR: NEGATIVE

## 2014-12-01 MED ORDER — ONDANSETRON 4 MG PO TBDP
4.0000 mg | ORAL_TABLET | Freq: Once | ORAL | Status: AC
  Administered 2014-12-01: 4 mg via ORAL
  Filled 2014-12-01: qty 1

## 2014-12-01 MED ORDER — SODIUM CHLORIDE 0.9 % IV BOLUS (SEPSIS)
1000.0000 mL | Freq: Once | INTRAVENOUS | Status: AC
  Administered 2014-12-01: 1000 mL via INTRAVENOUS

## 2014-12-01 MED ORDER — FENTANYL CITRATE (PF) 100 MCG/2ML IJ SOLN
100.0000 ug | Freq: Once | INTRAMUSCULAR | Status: AC
  Administered 2014-12-01: 100 ug via INTRAVENOUS
  Filled 2014-12-01: qty 2

## 2014-12-01 MED ORDER — OXYCODONE-ACETAMINOPHEN 5-325 MG PO TABS
1.0000 | ORAL_TABLET | Freq: Once | ORAL | Status: AC
  Administered 2014-12-01: 1 via ORAL
  Filled 2014-12-01: qty 1

## 2014-12-01 MED ORDER — KETOROLAC TROMETHAMINE 60 MG/2ML IM SOLN
60.0000 mg | Freq: Once | INTRAMUSCULAR | Status: AC
  Administered 2014-12-01: 60 mg via INTRAMUSCULAR
  Filled 2014-12-01: qty 2

## 2014-12-01 MED ORDER — DIAZEPAM 5 MG PO TABS
5.0000 mg | ORAL_TABLET | Freq: Once | ORAL | Status: AC
  Administered 2014-12-01: 5 mg via ORAL
  Filled 2014-12-01: qty 1

## 2014-12-01 MED ORDER — HYDROMORPHONE HCL 1 MG/ML IJ SOLN: 0.5000 mg | Freq: Once | INTRAMUSCULAR | Status: DC

## 2014-12-01 NOTE — ED Provider Notes (Signed)
CSN: [id number]     Arrival date & time 12/01/14  2014 History   First MD Initiated Contact with Patient 12/01/14 2043     Chief Complaint  Patient presents with  . Back Pain     (Consider location/radiation/quality/duration/timing/severity/associated sxs/prior Treatment) HPI   PCP: Valera Castle, MD PMH: hypertension, heart abnormality, gastritis, blood transfusion, depression, anxiety  Melissa Mayer is a 49 y.o.  female  CHIEF COMPLAINT: back pain  Time of onset: acute just prior to arrival, patient was sitting and resting, attempted to stand up and had severe pain initiating to the low back        Quality:  Stabbing, sharp and shooting        Severity:  severe        Progression:  unchanged Chronicity: reoccurring, pt reports that this has happened multiple times in the past but she has not been seen for it before. She decided to be evaluated today because she is tired of this happened over the past few years. The episodes are not increasing in frequency or worsening in severity. Risk factors: none Treatments tried:  rest Relieved by: laying still Worsened by: movement Associated Symptoms:  pain  ROS: The patient denies diaphoresis, fever, headache, weakness (general or focal), confusion, change of vision,  dysphagia, aphagia, shortness of breath,  abdominal pains, nausea, vomiting, diarrhea, lower extremity swelling, rash, neck pain, chest pain, bowel or urinary incontinence.   Past Medical History  Diagnosis Date  . Hypertension   . Heart abnormality     repiared 1996  . Gastritis   . Blood transfusion without reported diagnosis   . Depression   . Anxiety    Past Surgical History  Procedure Laterality Date  . Cardiac surgery    . Cholecystectomy     No family history on file. Social History  Substance Use Topics  . Smoking status: Current Every Day Smoker -- 1.00 packs/day    Types: Cigarettes  . Smokeless tobacco: Never Used  . Alcohol Use: Yes     Comment:  "special occasion"   OB History    No data available     Review of Systems  10 Systems reviewed and are negative for acute change except as noted in the HPI.    Allergies  Trazamine  Home Medications   Prior to Admission medications   Medication Sig Start Date End Date Taking? Authorizing Provider  cyclobenzaprine (FLEXERIL) 10 MG tablet Take 1 tablet (10 mg total) by mouth 2 (two) times daily as needed for muscle spasms. 12/02/14   Jerrell Hart Neva Seat, PA-C  gabapentin (NEURONTIN) 100 MG capsule Take 100 mg by mouth at bedtime.    Historical Provider, MD  HYDROcodone-acetaminophen (NORCO/VICODIN) 5-325 MG tablet Take 1 tablet by mouth every 6 (six) hours as needed. for pain 09/13/14   Historical Provider, MD  lisinopril-hydrochlorothiazide (PRINZIDE,ZESTORETIC) 20-25 MG per tablet Take 0.5 tablets by mouth daily.    Historical Provider, MD  meloxicam (MOBIC) 15 MG tablet Take 1 tablet (15 mg total) by mouth daily. 12/02/14   Felicite Zeimet Neva Seat, PA-C  meloxicam (MOBIC) 7.5 MG tablet Take 1 tablet (7.5 mg total) by mouth daily. 12/02/14   Marlon Pel, PA-C  naproxen (NAPROSYN) 375 MG tablet Take 1 tablet (375 mg total) by mouth 2 (two) times daily. 12/02/14   Shenoa Hattabaugh Neva Seat, PA-C   BP 102/56 mmHg  Pulse 86  Temp(Src) 98 F (36.7 C) (Oral)  Resp 16  SpO2 98%  LMP 11/01/2014 Physical Exam  Constitutional:  She appears well-developed and well-nourished. No distress.  HENT:  Head: Normocephalic and atraumatic.  Eyes: Pupils are equal, round, and reactive to light.  Neck: Normal range of motion. Neck supple.  Cardiovascular: Normal rate and regular rhythm.   Pulmonary/Chest: Effort normal.  Abdominal: Soft.  Musculoskeletal:       Arms: Symmetrical and physiologic strength to bilateral lower extremities.  Neurosensory function adequate to both legs Skin color is normal. Skin is warm and moist.  No step off deformity appreciated and no midline bony tenderness.   No crepitus,  laceration, effusion, induration, lesions Pedal pulses are symmetrical and palpable bilaterally   Tenderness to palpation of paraspinal and midline of spine No clonus on dorsiflextion   Neurological: She is alert.  Skin: Skin is warm and dry.  Nursing note and vitals reviewed.   ED Course  Procedures (including critical care time) Labs Review Labs Reviewed  URINALYSIS, ROUTINE W REFLEX MICROSCOPIC (NOT AT [facility])  POC URINE PREG, ED    Imaging Review Dg Thoracic Spine 2 View  12/02/2014  CLINICAL DATA:  Initial evaluation for sudden onset acute back pain. EXAM: THORACIC SPINE 2 VIEWS COMPARISON:  None. FINDINGS: Vertebral bodies are normally aligned with preservation of the normal thoracic kyphosis. Minimal scoliosis noted. Vertebral body heights are preserved. No acute fracture or listhesis. Mild multilevel degenerative endplate spurring. Visualized heart and lungs are unremarkable. No soft tissue abnormality. IMPRESSION: No radiographic evidence for acute abnormality within the thoracic spine. Electronically Signed   By: Rise Mu M.D.   On: 12/02/2014 00:35   Dg Lumbar Spine Complete  12/02/2014  CLINICAL DATA:  Initial evaluation for acute onset back pain. EXAM: LUMBAR SPINE - COMPLETE 4+ VIEW COMPARISON:  None. FINDINGS: Mild levoscoliosis. Vertebral bodies are otherwise normally aligned with preservation of the normal lumbar lordosis. Vertebral body heights maintained. No acute fracture listhesis. Mild degenerative endplate sclerosis present at L4-5 and L5-S1. Mild bilateral facet arthrosis present at these levels as well. Paraspinous soft tissues within normal limits. Cholecystectomy clips noted. SI joints approximated. IMPRESSION: 1. No radiographic evidence for acute abnormality within the lumbar spine. 2. Mild degenerative disc disease and facet arthrosis at L4-5 and L5-S1. Electronically Signed   By: Rise Mu M.D.   On: 12/02/2014 00:37   I have personally  reviewed and evaluated these images and lab results as part of my medical decision-making.   EKG Interpretation None      MDM   Final diagnoses:  Low back pain without sciatica, unspecified back pain laterality  DDD (degenerative disc disease), lumbosacral    Urinalysis and pregnancy test are unremarkable. DG thoracic is within normal limits Lumbar spine shows signs of facet arthropathy and DDD. Pt will need ortho referral.   Fonda Kinder Ward, RN 12/01/2014 22:52   Expand All Collapse All   Pt ambulated independently to the bathroom w/ a steady gait.        meloxicam (MOBIC) 15 MG tablet Take 1 tablet (15 mg total) by mouth daily. 30 tablet Marlon Pel, PA-C    meloxicam (MOBIC) 7.5 MG tablet Take 1 tablet (7.5 mg total) by mouth daily. 30 tablet Marlon Pel, PA-C   naproxen (NAPROSYN) 375 MG tablet Take 1 tablet (375 mg total) by mouth 2 (two) times daily. 20 tablet Marlon Pel, PA-C     49 y.o.Melissa Mayer's  with back pain.   No neurological deficits and normal neuro exam. No loss of bowel or bladder control. No concern for cauda equina at  this time base on HPI and physical exam findings. No fever, night sweats, weight loss, h/o cancer, IVDU. The patient can walk with some discomfort.   Patient Plan 1. Medications: NSAIDs and/or muscle relaxer. Cont usual home medications unless otherwise directed. 2. Treatment: rest, drink plenty of fluids, gentle stretching as discussed, alternate ice and heat  3. Follow Up: Please followup with your primary doctor for discussion of your diagnoses and further evaluation after today's visit; if you do not have a primary care doctor use the resource guide provided to find one  Advised to follow-up with the orthopedist if symptoms do not start to resolve in the next 2-3 days. If develop loss of bowel or urinary control return to the ED as soon as possible for further evaluation. To take the medications as prescribed as they can cause  harm if not taken appropriately.   Vital signs are stable at discharge. Filed Vitals:   12/02/14 0049  BP: 102/56  Pulse: 86  Temp:   Resp: 16    Patient/guardian has voiced understanding and agreed to follow-up with the PCP or specialist.       Marlon Peliffany Blessen Kimbrough, PA-C 12/02/14 0140  Margarita Grizzleanielle Ray, MD 12/05/14 1605

## 2014-12-01 NOTE — ED Notes (Signed)
Pt does not wish to try for UA atm 

## 2014-12-01 NOTE — ED Notes (Signed)
Pt presents via [facility] d/t sudden onset generalized back pain 10/10.  Pt reported to [facility] no injury to back was sitting upon their arrival.

## 2014-12-01 NOTE — ED Notes (Signed)
[facility] Expected date:  Expected time:  Means of arrival:  Comments: 49 yo F  Back pain

## 2014-12-01 NOTE — ED Notes (Signed)
Pt ambulated independently to the bathroom w/ a steady gait.

## 2014-12-01 NOTE — ED Notes (Signed)
Patient transported to X-ray 

## 2014-12-02 MED ORDER — CYCLOBENZAPRINE HCL 10 MG PO TABS: 10.0000 mg | ORAL_TABLET | Freq: Two times a day (BID) | ORAL | Status: DC | PRN

## 2014-12-02 MED ORDER — MELOXICAM 15 MG PO TABS: 15.0000 mg | ORAL_TABLET | Freq: Every day | ORAL | Status: DC

## 2014-12-02 MED ORDER — NAPROXEN 375 MG PO TABS: 375.0000 mg | ORAL_TABLET | Freq: Two times a day (BID) | ORAL | Status: DC

## 2014-12-02 MED ORDER — MELOXICAM 7.5 MG PO TABS: 7.5000 mg | ORAL_TABLET | Freq: Every day | ORAL | Status: DC

## 2014-12-02 NOTE — Discharge Instructions (Signed)
Degenerative Disk Disease  Degenerative disk disease is a condition caused by the changes that occur in spinal disks as you grow older. Spinal disks are soft and compressible disks located between the bones of your spine (vertebrae). These disks act like shock absorbers. Degenerative disk disease can affect the whole spine. However, the neck and lower back are most commonly affected. Many changes can occur in the spinal disks with aging, such as:  · The spinal disks may dry and shrink.  · Small tears may occur in the tough, outer covering of the disk (annulus).  · The disk space may become smaller due to loss of water.  · Abnormal growths in the bone (spurs) may occur. This can put pressure on the nerve roots exiting the spinal canal, causing pain.  · The spinal canal may become narrowed.  RISK FACTORS   · Being overweight.  · Having a family history of degenerative disk disease.  · Smoking.  · There is increased risk if you are doing heavy lifting or have a sudden injury.  SIGNS AND SYMPTOMS   Symptoms vary from person to person and may include:  · Pain that varies in intensity. Some people have no pain, while others have severe pain. The location of the pain depends on the part of your backbone that is affected.  ¨ You will have neck or arm pain if a disk in the neck area is affected.  ¨ You will have pain in your back, buttocks, or legs if a disk in the lower back is affected.  · Pain that becomes worse while bending, reaching up, or with twisting movements.  · Pain that may start gradually and then get worse as time passes. It may also start after a major or minor injury.  · Numbness or tingling in the arms or legs.  DIAGNOSIS   Your health care provider will ask you about your symptoms and about activities or habits that may cause the pain. He or she may also ask about any injuries, diseases, or treatments you have had. Your health care provider will examine you to check for the range of movement that is  possible in the affected area, to check for strength in your extremities, and to check for sensation in the areas of the arms and legs supplied by different nerve roots. You may also have:   · An X-ray of the spine.  · Other imaging tests, such as MRI.  TREATMENT   Your health care provider will advise you on the best plan for treatment. Treatment may include:  · Medicines.  · Rehabilitation exercises.  HOME CARE INSTRUCTIONS   · Follow proper lifting and walking techniques as advised by your health care provider.  · Maintain good posture.  · Exercise regularly as advised by your health care provider.  · Perform relaxation exercises.  · Change your sitting, standing, and sleeping habits as advised by your health care provider.  · Change positions frequently.  · Lose weight or maintain a healthy weight as advised by your health care provider.  · Do not use any tobacco products, including cigarettes, chewing tobacco, or electronic cigarettes. If you need help quitting, ask your health care provider.  · Wear supportive footwear.  · Take medicines only as directed by your health care provider.  SEEK MEDICAL CARE IF:   · Your pain does not go away within 1-4 weeks.  · You have significant appetite or weight loss.  SEEK IMMEDIATE MEDICAL CARE IF:   ·   Your pain is severe.  · You notice weakness in your arms, hands, or legs.  · You begin to lose control of your bladder or bowel movements.  · You have fevers or night sweats.  MAKE SURE YOU:   · Understand these instructions.  · Will watch your condition.  · Will get help right away if you are not doing well or get worse.     This information is not intended to replace advice given to you by your health care provider. Make sure you discuss any questions you have with your health care provider.     Document Released: 11/15/2006 Document Revised: 02/08/2014 Document Reviewed: 05/22/2013  Elsevier Interactive Patient Education ©2016 [employer]

## 2015-08-25 IMAGING — DX DG CHEST 2V
2 series · 2 of 2 positions shown · non-contrast
Comparison: 06/27/2014

CLINICAL DATA: Syncope, chest pain, cough

EXAM:
CHEST  2 VIEW

[chest lat]
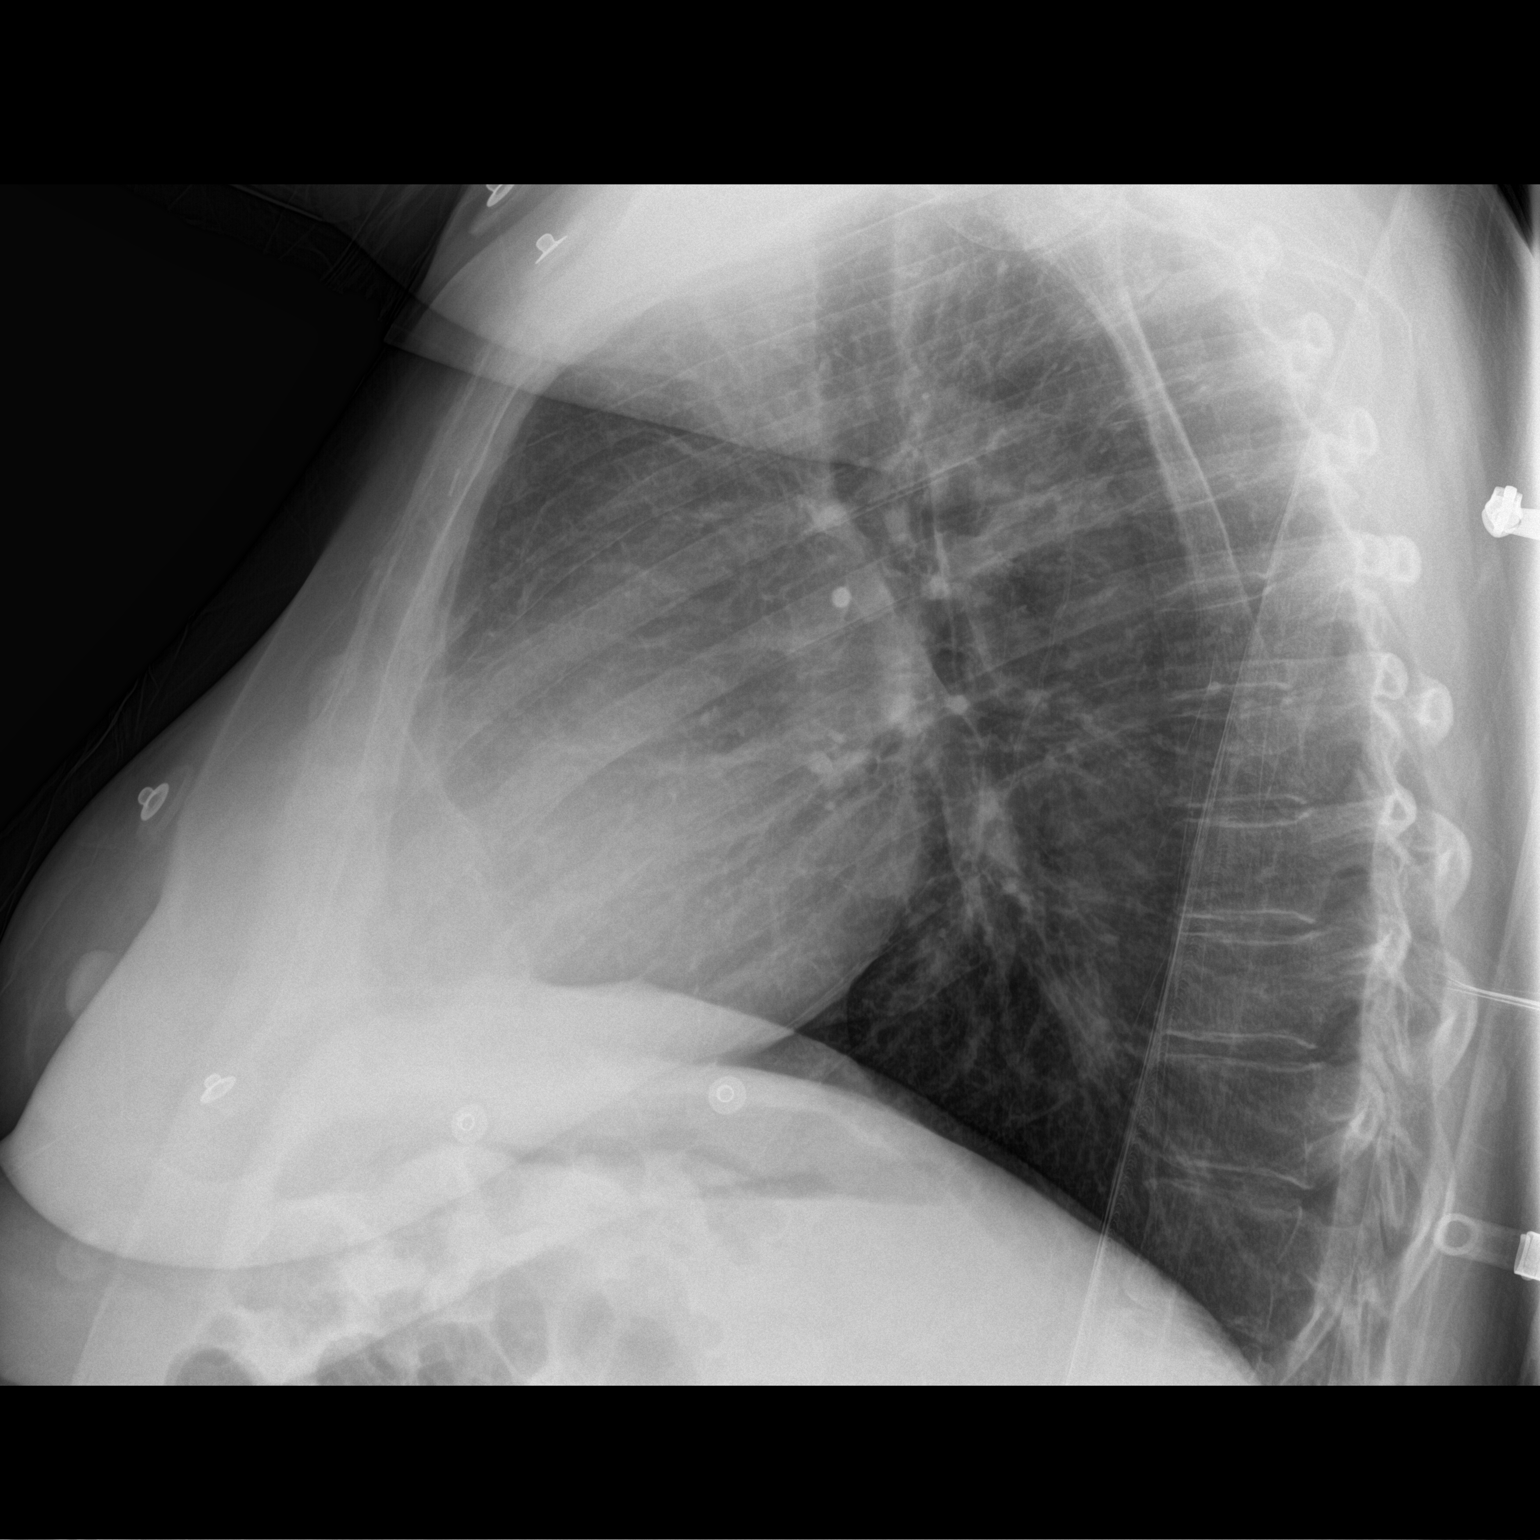

[chest ap]
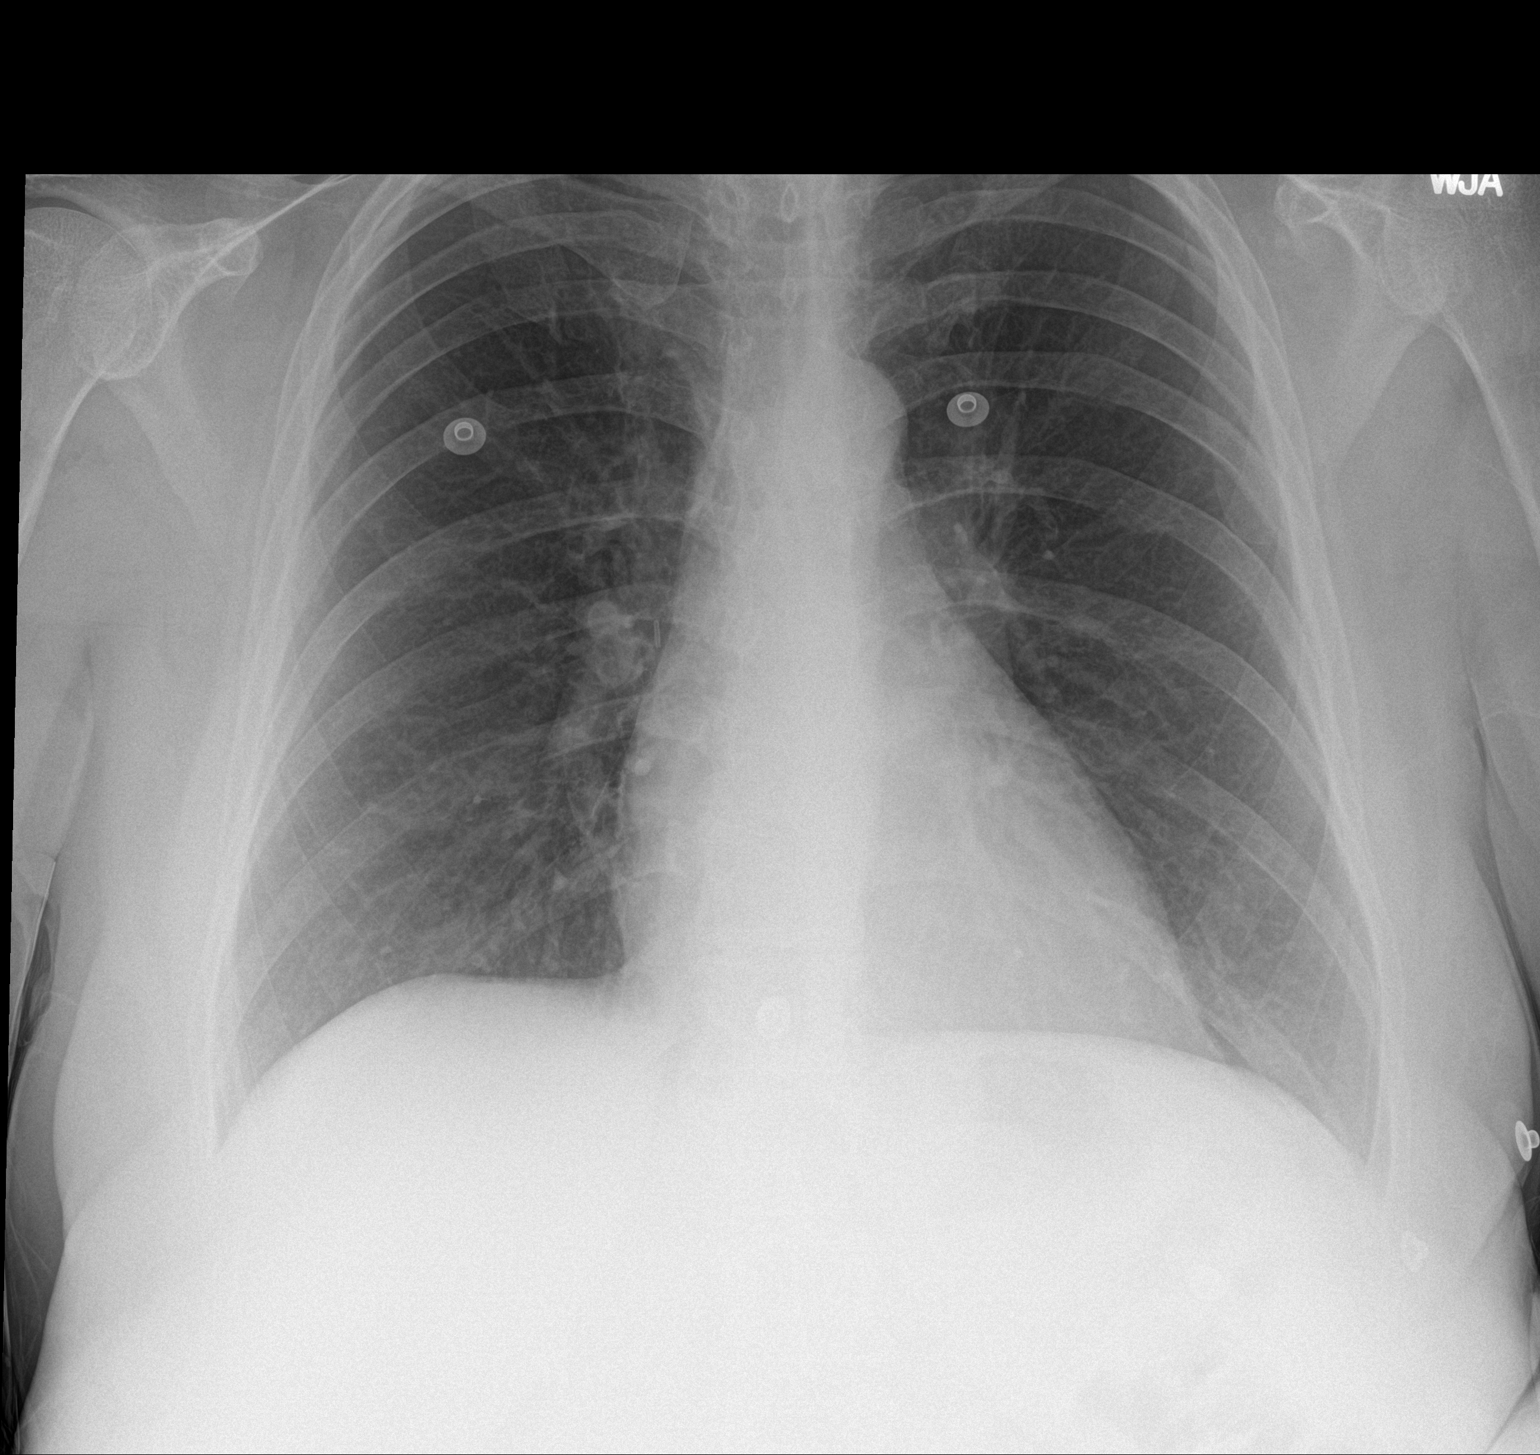

[2 of 2 positions shown; findings below may reference images not displayed]

FINDINGS: Lungs are clear.  No pleural effusion or pneumothorax.

The heart is normal in size.

Visualized osseous structures are within normal limits.
IMPRESSION: Normal chest radiographs.

## 2015-11-16 ENCOUNTER — Emergency Department (HOSPITAL_COMMUNITY)
Admission: EM | Admit: 2015-11-16 | Discharge: 2015-11-16 | Disposition: A | Discharge status: Home / Self Care | Payer: Self-pay | Attending: Emergency Medicine | Admitting: Emergency Medicine

## 2015-11-16 ENCOUNTER — Encounter (HOSPITAL_COMMUNITY): Payer: Self-pay | Admitting: Emergency Medicine

## 2015-11-16 DIAGNOSIS — Z79899 Other long term (current) drug therapy: Secondary | ICD-10-CM | POA: Insufficient documentation

## 2015-11-16 DIAGNOSIS — F1010 Alcohol abuse, uncomplicated: Secondary | ICD-10-CM | POA: Insufficient documentation

## 2015-11-16 DIAGNOSIS — F410 Panic disorder [episodic paroxysmal anxiety] without agoraphobia: Secondary | ICD-10-CM | POA: Insufficient documentation

## 2015-11-16 DIAGNOSIS — I10 Essential (primary) hypertension: Secondary | ICD-10-CM | POA: Insufficient documentation

## 2015-11-16 DIAGNOSIS — F17210 Nicotine dependence, cigarettes, uncomplicated: Secondary | ICD-10-CM | POA: Insufficient documentation

## 2015-11-16 LAB — CBC
HEMATOCRIT: 43.3 % (ref 36.0–46.0)
Hemoglobin: 15.7 g/dL — ABNORMAL HIGH (ref 12.0–15.0)
MCH: 34.6 pg — AB (ref 26.0–34.0)
MCHC: 36.3 g/dL — AB (ref 30.0–36.0)
MCV: 95.4 fL (ref 78.0–100.0)
PLATELETS: 219 10*3/uL (ref 150–400)
RBC: 4.54 MIL/uL (ref 3.87–5.11)
RDW: 13.8 % (ref 11.5–15.5)
WBC: 9.8 10*3/uL (ref 4.0–10.5)

## 2015-11-16 LAB — URINALYSIS, ROUTINE W REFLEX MICROSCOPIC
GLUCOSE, UA: NEGATIVE mg/dL
KETONES UR: NEGATIVE mg/dL
LEUKOCYTES UA: NEGATIVE
Nitrite: POSITIVE — AB
PH: 5.5 (ref 5.0–8.0)
Protein, ur: 30 mg/dL — AB
Specific Gravity, Urine: 1.027 (ref 1.005–1.030)

## 2015-11-16 LAB — COMPREHENSIVE METABOLIC PANEL
ALBUMIN: 4.5 g/dL (ref 3.5–5.0)
ALT: 56 U/L — AB (ref 14–54)
AST: 92 U/L — AB (ref 15–41)
Alkaline Phosphatase: 169 U/L — ABNORMAL HIGH (ref 38–126)
Anion gap: 16 — ABNORMAL HIGH (ref 5–15)
BUN: 10 mg/dL (ref 6–20)
CHLORIDE: 108 mmol/L (ref 101–111)
CO2: 19 mmol/L — ABNORMAL LOW (ref 22–32)
CREATININE: 0.89 mg/dL (ref 0.44–1.00)
Calcium: 9 mg/dL (ref 8.9–10.3)
GFR calc Af Amer: >60 mL/min (ref >60)
GFR calc non Af Amer: >60 mL/min (ref >60)
GLUCOSE: 103 mg/dL — AB (ref 65–99)
POTASSIUM: 3.3 mmol/L — AB (ref 3.5–5.1)
Sodium: 143 mmol/L (ref 135–145)
Total Bilirubin: 0.6 mg/dL (ref 0.3–1.2)
Total Protein: 7.9 g/dL (ref 6.5–8.1)

## 2015-11-16 LAB — URINE MICROSCOPIC-ADD ON: RBC / HPF: NONE SEEN RBC/hpf (ref 0–5)

## 2015-11-16 LAB — LIPASE, BLOOD: LIPASE: 36 U/L (ref 11–51)

## 2015-11-16 MED ORDER — ONDANSETRON HCL 4 MG/2ML IJ SOLN
4.0000 mg | Freq: Once | INTRAMUSCULAR | Status: AC
  Administered 2015-11-16: 4 mg via INTRAVENOUS
  Filled 2015-11-16: qty 2

## 2015-11-16 MED ORDER — LORAZEPAM 2 MG/ML IJ SOLN
1.0000 mg | Freq: Once | INTRAMUSCULAR | Status: AC
  Administered 2015-11-16: 1 mg via INTRAVENOUS
  Filled 2015-11-16: qty 1

## 2015-11-16 MED ORDER — SODIUM CHLORIDE 0.9 % IV BOLUS (SEPSIS)
1000.0000 mL | Freq: Once | INTRAVENOUS | Status: AC
  Administered 2015-11-16: 1000 mL via INTRAVENOUS

## 2015-11-16 MED ORDER — ONDANSETRON 4 MG PO TBDP
4.0000 mg | ORAL_TABLET | Freq: Once | ORAL | Status: AC | PRN
  Administered 2015-11-16: 4 mg via ORAL
  Filled 2015-11-16: qty 1

## 2015-11-16 MED ORDER — CHLORDIAZEPOXIDE HCL 25 MG PO CAPS: ORAL_CAPSULE | ORAL | 0 refills | Status: DC

## 2015-11-16 NOTE — ED Notes (Signed)
Pt states "I may be withdrawing." Pt reports her last drink was "a couple sips of beer this morning."

## 2015-11-16 NOTE — ED Triage Notes (Addendum)
Per EMS, pt escorted [facility] [employer] [facility], pt c/o increased anxiety attacks today. Pt reports she wants her liver checked because she has been "drinking a lot". Hx hepatitis.   Pt also reports RUQ pain x1 year. Reports nausea and diarrhea but denies vomiting.  Pt cooperative in triage.

## 2015-11-16 NOTE — ED Notes (Signed)
Bed: [facility] Expected date:  Expected time:  Means of arrival:  Comments: 

## 2015-11-16 NOTE — ED Notes (Signed)
MD at bedside. 

## 2015-11-16 NOTE — ED Provider Notes (Signed)
[facility] DEPT Provider Note   CSN: [id number] Arrival date & time: 11/16/15  1639     History   Chief Complaint Chief Complaint  Patient presents with  . Anxiety  . Abdominal Pain    HPI Melissa Mayer is a 50 y.o. female.  Pt presents to the ED from jail.  She has a hx of drinking daily alcohol and has not drank anything since this morning.  En route to the jail, she told the police that she needed eval for her panic attacks.  Now, she thinks she is withdrawing from alcohol.  She has had n/v.  The triage nurse gave her zofran which has not helped.      Past Medical History:  Diagnosis Date  . Anxiety   . Blood transfusion without reported diagnosis   . Depression   . Gastritis   . Heart abnormality    repiared 1996  . Hypertension     There are no active problems to display for this patient.   Past Surgical History:  Procedure Laterality Date  . CARDIAC SURGERY    . CHOLECYSTECTOMY      OB History    No data available       Home Medications    Prior to Admission medications   Medication Sig Start Date End Date Taking? Authorizing Provider  chlordiazePOXIDE (LIBRIUM) 25 MG capsule 50mg  PO TID x 1D, then 25-50mg  PO BID X 1D, then 25-50mg  PO QD X 1D 11/16/15   Jacalyn LefevreJulie Pratyush Ammon, MD  cyclobenzaprine (FLEXERIL) 10 MG tablet Take 1 tablet (10 mg total) by mouth 2 (two) times daily as needed for muscle spasms. 12/02/14   Tiffany Neva SeatGreene, PA-C  gabapentin (NEURONTIN) 100 MG capsule Take 100 mg by mouth at bedtime.    Historical Provider, MD  HYDROcodone-acetaminophen (NORCO/VICODIN) 5-325 MG tablet Take 1 tablet by mouth every 6 (six) hours as needed. for pain 09/13/14   Historical Provider, MD  lisinopril-hydrochlorothiazide (PRINZIDE,ZESTORETIC) 20-25 MG per tablet Take 0.5 tablets by mouth daily.    Historical Provider, MD  meloxicam (MOBIC) 15 MG tablet Take 1 tablet (15 mg total) by mouth daily. 12/02/14   Tiffany Neva SeatGreene, PA-C  meloxicam (MOBIC) 7.5 MG  tablet Take 1 tablet (7.5 mg total) by mouth daily. 12/02/14   Marlon Peliffany Greene, PA-C  naproxen (NAPROSYN) 375 MG tablet Take 1 tablet (375 mg total) by mouth 2 (two) times daily. 12/02/14   Marlon Peliffany Greene, PA-C    Family History History reviewed. No pertinent family history.  Social History Social History  Substance Use Topics  . Smoking status: Current Every Day Smoker    Packs/day: 1.00    Types: Cigarettes  . Smokeless tobacco: Never Used  . Alcohol use Yes     Comment: "special occasion"     Allergies   Trazamine [trazodone & diet manage prod]   Review of Systems Review of Systems  Gastrointestinal: Positive for abdominal pain, nausea and vomiting.  Psychiatric/Behavioral: The patient is nervous/anxious.   All other systems reviewed and are negative.    Physical Exam Updated Vital Signs BP 123/74 (BP Location: Left Arm)   Pulse 104   Temp 98.3 F (36.8 C) (Oral)   Resp 18   Ht 5\' 3"  (1.6 m)   Wt 162 lb (73.5 kg)   LMP 09/16/2015   SpO2 94%   BMI 28.70 kg/m   Physical Exam  Constitutional: She is oriented to person, place, and time. She appears well-developed. She appears distressed.  HENT:  Head:  Normocephalic and atraumatic.  Right Ear: External ear normal.  Left Ear: External ear normal.  Nose: Nose normal.  Mouth/Throat: Oropharynx is clear and moist.  Eyes: Conjunctivae and EOM are normal. Pupils are equal, round, and reactive to light.  Neck: Normal range of motion. Neck supple.  Cardiovascular: Regular rhythm, normal heart sounds and intact distal pulses.  Tachycardia present.   Pulmonary/Chest: Effort normal and breath sounds normal.  Abdominal: Soft. Bowel sounds are normal.  Musculoskeletal: Normal range of motion.  Neurological: She is alert and oriented to person, place, and time.  Skin: Skin is warm.  Psychiatric: Her mood appears anxious.  Nursing note and vitals reviewed.    ED Treatments / Results  Labs (all labs ordered are  listed, but only abnormal results are displayed) Labs Reviewed  COMPREHENSIVE METABOLIC PANEL - Abnormal; Notable for the following:       Result Value   Potassium 3.3 (*)    CO2 19 (*)    Glucose, Bld 103 (*)    AST 92 (*)    ALT 56 (*)    Alkaline Phosphatase 169 (*)    Anion gap 16 (*)    All other components within normal limits  CBC - Abnormal; Notable for the following:    Hemoglobin 15.7 (*)    MCH 34.6 (*)    MCHC 36.3 (*)    All other components within normal limits  URINALYSIS, ROUTINE W REFLEX MICROSCOPIC (NOT AT [facility]) - Abnormal; Notable for the following:    Color, Urine AMBER (*)    APPearance HAZY (*)    Hgb urine dipstick TRACE (*)    Bilirubin Urine SMALL (*)    Protein, ur 30 (*)    Nitrite POSITIVE (*)    All other components within normal limits  URINE MICROSCOPIC-ADD ON - Abnormal; Notable for the following:    Squamous Epithelial / LPF 0-5 (*)    Bacteria, UA RARE (*)    All other components within normal limits  LIPASE, BLOOD    EKG  EKG Interpretation None       Radiology No results found.  Procedures Procedures (including critical care time)  Medications Ordered in ED Medications  ondansetron (ZOFRAN-ODT) disintegrating tablet 4 mg (4 mg Oral Given 11/16/15 1754)  sodium chloride 0.9 % bolus 1,000 mL (1,000 mLs Intravenous New Bag/Given 11/16/15 1945)  ondansetron (ZOFRAN) injection 4 mg (4 mg Intravenous Given 11/16/15 1945)  LORazepam (ATIVAN) injection 1 mg (1 mg Intravenous Given 11/16/15 1946)     Initial Impression / Assessment and Plan / ED Course  I have reviewed the triage vital signs and the nursing notes.  Pertinent labs & imaging results that were available during my care of the patient were reviewed by me and considered in my medical decision making (see chart for details).  Clinical Course   Pt is looking much better.  She is not shaking or vomiting.  She will be d/c'd to the jail.  Final Clinical Impressions(s) /  ED Diagnoses   Final diagnoses:  Alcohol abuse  Panic attack    New Prescriptions New Prescriptions   CHLORDIAZEPOXIDE (LIBRIUM) 25 MG CAPSULE    50mg  PO TID x 1D, then 25-50mg  PO BID X 1D, then 25-50mg  PO QD X 1D     Jacalyn Lefevre, MD 11/16/15 2050

## 2015-11-24 ENCOUNTER — Emergency Department (HOSPITAL_COMMUNITY)
Admission: EM | Admit: 2015-11-24 | Discharge: 2015-11-25 | Disposition: A | Discharge status: Other Acute Inpatient Hospital | Payer: Medicaid Other | Attending: Emergency Medicine | Admitting: Emergency Medicine

## 2015-11-24 ENCOUNTER — Encounter (HOSPITAL_COMMUNITY): Payer: Self-pay | Admitting: Emergency Medicine

## 2015-11-24 DIAGNOSIS — F1010 Alcohol abuse, uncomplicated: Secondary | ICD-10-CM | POA: Insufficient documentation

## 2015-11-24 DIAGNOSIS — Z79899 Other long term (current) drug therapy: Secondary | ICD-10-CM | POA: Insufficient documentation

## 2015-11-24 DIAGNOSIS — S7011XA Contusion of right thigh, initial encounter: Secondary | ICD-10-CM | POA: Insufficient documentation

## 2015-11-24 DIAGNOSIS — S5011XA Contusion of right forearm, initial encounter: Secondary | ICD-10-CM | POA: Insufficient documentation

## 2015-11-24 DIAGNOSIS — Y929 Unspecified place or not applicable: Secondary | ICD-10-CM | POA: Insufficient documentation

## 2015-11-24 DIAGNOSIS — S5012XA Contusion of left forearm, initial encounter: Secondary | ICD-10-CM | POA: Insufficient documentation

## 2015-11-24 DIAGNOSIS — R45851 Suicidal ideations: Secondary | ICD-10-CM

## 2015-11-24 DIAGNOSIS — F17210 Nicotine dependence, cigarettes, uncomplicated: Secondary | ICD-10-CM | POA: Insufficient documentation

## 2015-11-24 DIAGNOSIS — S7012XA Contusion of left thigh, initial encounter: Secondary | ICD-10-CM | POA: Insufficient documentation

## 2015-11-24 DIAGNOSIS — F419 Anxiety disorder, unspecified: Secondary | ICD-10-CM

## 2015-11-24 DIAGNOSIS — Y9389 Activity, other specified: Secondary | ICD-10-CM | POA: Insufficient documentation

## 2015-11-24 DIAGNOSIS — I10 Essential (primary) hypertension: Secondary | ICD-10-CM | POA: Insufficient documentation

## 2015-11-24 DIAGNOSIS — Y999 Unspecified external cause status: Secondary | ICD-10-CM | POA: Insufficient documentation

## 2015-11-24 LAB — COMPREHENSIVE METABOLIC PANEL
ALK PHOS: 138 U/L — AB (ref 38–126)
ALT: 38 U/L (ref 14–54)
ANION GAP: 12 (ref 5–15)
AST: 32 U/L (ref 15–41)
Albumin: 4.4 g/dL (ref 3.5–5.0)
BILIRUBIN TOTAL: 0.6 mg/dL (ref 0.3–1.2)
BUN: 5 mg/dL — ABNORMAL LOW (ref 6–20)
CALCIUM: 9.5 mg/dL (ref 8.9–10.3)
CO2: 22 mmol/L (ref 22–32)
CREATININE: 0.73 mg/dL (ref 0.44–1.00)
Chloride: 106 mmol/L (ref 101–111)
Glucose, Bld: 107 mg/dL — ABNORMAL HIGH (ref 65–99)
Potassium: 3.2 mmol/L — ABNORMAL LOW (ref 3.5–5.1)
Sodium: 140 mmol/L (ref 135–145)
Total Protein: 7.7 g/dL (ref 6.5–8.1)

## 2015-11-24 LAB — URINALYSIS, ROUTINE W REFLEX MICROSCOPIC
Bilirubin Urine: NEGATIVE
Glucose, UA: NEGATIVE mg/dL
Hgb urine dipstick: NEGATIVE
Ketones, ur: NEGATIVE mg/dL
Nitrite: NEGATIVE
PROTEIN: NEGATIVE mg/dL
Specific Gravity, Urine: 1.004 — ABNORMAL LOW (ref 1.005–1.030)
pH: 6 (ref 5.0–8.0)

## 2015-11-24 LAB — URINE MICROSCOPIC-ADD ON: RBC / HPF: NONE SEEN RBC/hpf (ref 0–5)

## 2015-11-24 LAB — CBC WITH DIFFERENTIAL/PLATELET
BASOS PCT: 1 %
Basophils Absolute: 0 10*3/uL (ref 0.0–0.1)
EOS ABS: 0.2 10*3/uL (ref 0.0–0.7)
EOS PCT: 4 %
HCT: 44.6 % (ref 36.0–46.0)
Hemoglobin: 15.6 g/dL — ABNORMAL HIGH (ref 12.0–15.0)
LYMPHS ABS: 2.8 10*3/uL (ref 0.7–4.0)
Lymphocytes Relative: 46 %
MCH: 34.9 pg — AB (ref 26.0–34.0)
MCHC: 35 g/dL (ref 30.0–36.0)
MCV: 99.8 fL (ref 78.0–100.0)
Monocytes Absolute: 0.4 10*3/uL (ref 0.1–1.0)
Monocytes Relative: 7 %
Neutro Abs: 2.5 10*3/uL (ref 1.7–7.7)
Neutrophils Relative %: 42 %
PLATELETS: 254 10*3/uL (ref 150–400)
RBC: 4.47 MIL/uL (ref 3.87–5.11)
RDW: 14.6 % (ref 11.5–15.5)
WBC: 5.9 10*3/uL (ref 4.0–10.5)

## 2015-11-24 LAB — RAPID URINE DRUG SCREEN, HOSP PERFORMED
AMPHETAMINES: NOT DETECTED
Barbiturates: NOT DETECTED
Benzodiazepines: NOT DETECTED
COCAINE: NOT DETECTED
OPIATES: NOT DETECTED
Tetrahydrocannabinol: POSITIVE — AB

## 2015-11-24 LAB — SALICYLATE LEVEL

## 2015-11-24 LAB — ETHANOL: ALCOHOL ETHYL (B): 264 mg/dL — AB (ref <5)

## 2015-11-24 LAB — ACETAMINOPHEN LEVEL: Acetaminophen (Tylenol), Serum: <10 ug/mL — ABNORMAL LOW (ref 10–30)

## 2015-11-24 MED ORDER — PANTOPRAZOLE SODIUM 40 MG PO TBEC
40.0000 mg | DELAYED_RELEASE_TABLET | Freq: Every day | ORAL | Status: DC
Start: 2015-11-24 — End: 2015-11-25
  Administered 2015-11-24: 40 mg via ORAL
  Filled 2015-11-24: qty 1

## 2015-11-24 MED ORDER — AMLODIPINE BESYLATE 5 MG PO TABS
5.0000 mg | ORAL_TABLET | Freq: Every day | ORAL | Status: DC
  Administered 2015-11-24: 5 mg via ORAL
  Filled 2015-11-24: qty 1

## 2015-11-24 MED ORDER — VITAMIN B-1 100 MG PO TABS
100.0000 mg | ORAL_TABLET | Freq: Every day | ORAL | Status: DC
  Administered 2015-11-24: 100 mg via ORAL
  Filled 2015-11-24: qty 1

## 2015-11-24 MED ORDER — NICOTINE 21 MG/24HR TD PT24
21.0000 mg | MEDICATED_PATCH | Freq: Every day | TRANSDERMAL | Status: DC
  Administered 2015-11-24: 21 mg via TRANSDERMAL
  Filled 2015-11-24: qty 1

## 2015-11-24 MED ORDER — PAROXETINE HCL 20 MG PO TABS
20.0000 mg | ORAL_TABLET | Freq: Every day | ORAL | Status: DC
  Administered 2015-11-24: 20 mg via ORAL
  Filled 2015-11-24: qty 1

## 2015-11-24 MED ORDER — LORAZEPAM 1 MG PO TABS
1.0000 mg | ORAL_TABLET | Freq: Once | ORAL | Status: AC
  Administered 2015-11-24: 1 mg via ORAL
  Filled 2015-11-24: qty 1

## 2015-11-24 MED ORDER — LORAZEPAM 1 MG PO TABS: 0.0000 mg | ORAL_TABLET | Freq: Two times a day (BID) | ORAL | Status: DC

## 2015-11-24 MED ORDER — IBUPROFEN 200 MG PO TABS: 600.0000 mg | ORAL_TABLET | Freq: Three times a day (TID) | ORAL | Status: DC | PRN

## 2015-11-24 MED ORDER — ACETAMINOPHEN 325 MG PO TABS: 650.0000 mg | ORAL_TABLET | ORAL | Status: DC | PRN

## 2015-11-24 MED ORDER — LORAZEPAM 1 MG PO TABS: 0.0000 mg | ORAL_TABLET | Freq: Four times a day (QID) | ORAL | Status: DC

## 2015-11-24 MED ORDER — THIAMINE HCL 100 MG/ML IJ SOLN: 100.0000 mg | Freq: Every day | INTRAMUSCULAR | Status: DC

## 2015-11-24 MED ORDER — ALUM & MAG HYDROXIDE-SIMETH 200-200-20 MG/5ML PO SUSP: 30.0000 mL | ORAL | Status: DC | PRN

## 2015-11-24 MED ORDER — ONDANSETRON HCL 4 MG PO TABS
4.0000 mg | ORAL_TABLET | Freq: Three times a day (TID) | ORAL | Status: DC | PRN
  Administered 2015-11-24: 4 mg via ORAL
  Filled 2015-11-24: qty 1
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
## 2015-11-24 NOTE — ED Notes (Signed)
PT does not want anything to eat at this time

## 2015-11-24 NOTE — ED Notes (Signed)
Bed: [facility]12 Expected date:  Expected time:  Means of arrival:  Comments: 
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
## 2015-11-24 NOTE — BH Assessment (Signed)
Assessor spoke with Nena Alexanderourtney A Jimerson, RN to advise the [facility] .   Princess BruinsAquicha Duff, MSW, Theresia MajorsLCSWA
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
## 2015-11-24 NOTE — ED Notes (Signed)
Notified by TTS that pt meets in patient criteria.

## 2015-11-24 NOTE — ED Notes (Signed)
All cords and belongings removed from room or locked away in cabinets.

## 2015-11-25 ENCOUNTER — Inpatient Hospital Stay (HOSPITAL_COMMUNITY)
Admission: AD | Admit: 2015-11-25 | Discharge: 2015-11-29 | DRG: 897 | Disposition: A | Discharge status: Home / Self Care | Payer: Federal, State, Local not specified - Other | Source: Intra-hospital | Attending: Psychiatry | Admitting: Psychiatry

## 2015-11-25 ENCOUNTER — Encounter (HOSPITAL_COMMUNITY): Payer: Self-pay | Admitting: *Deleted

## 2015-11-25 DIAGNOSIS — F329 Major depressive disorder, single episode, unspecified: Secondary | ICD-10-CM | POA: Diagnosis present

## 2015-11-25 DIAGNOSIS — R45851 Suicidal ideations: Secondary | ICD-10-CM | POA: Diagnosis present

## 2015-11-25 DIAGNOSIS — F1024 Alcohol dependence with alcohol-induced mood disorder: Principal | ICD-10-CM | POA: Diagnosis present

## 2015-11-25 DIAGNOSIS — Z79899 Other long term (current) drug therapy: Secondary | ICD-10-CM

## 2015-11-25 DIAGNOSIS — Z9889 Other specified postprocedural states: Secondary | ICD-10-CM

## 2015-11-25 DIAGNOSIS — G4700 Insomnia, unspecified: Secondary | ICD-10-CM | POA: Diagnosis present

## 2015-11-25 DIAGNOSIS — Z9049 Acquired absence of other specified parts of digestive tract: Secondary | ICD-10-CM

## 2015-11-25 DIAGNOSIS — F17210 Nicotine dependence, cigarettes, uncomplicated: Secondary | ICD-10-CM | POA: Diagnosis present

## 2015-11-25 DIAGNOSIS — F1094 Alcohol use, unspecified with alcohol-induced mood disorder: Secondary | ICD-10-CM | POA: Diagnosis not present

## 2015-11-25 DIAGNOSIS — I10 Essential (primary) hypertension: Secondary | ICD-10-CM | POA: Diagnosis present

## 2015-11-25 DIAGNOSIS — Z888 Allergy status to other drugs, medicaments and biological substances status: Secondary | ICD-10-CM

## 2015-11-25 MED ORDER — GABAPENTIN 100 MG PO CAPS
200.0000 mg | ORAL_CAPSULE | Freq: Two times a day (BID) | ORAL | Status: DC
  Administered 2015-11-25 – 2015-11-29 (×8): 200 mg via ORAL
  Filled 2015-11-25: qty 2
  Filled 2015-11-25: qty 28
  Filled 2015-11-25 (×9): qty 2
  Filled 2015-11-25: qty 28

## 2015-11-25 MED ORDER — HYDROXYZINE HCL 25 MG PO TABS: 25.0000 mg | ORAL_TABLET | Freq: Three times a day (TID) | ORAL | Status: DC | PRN

## 2015-11-25 MED ORDER — ADULT MULTIVITAMIN W/MINERALS CH
1.0000 | ORAL_TABLET | Freq: Every day | ORAL | Status: DC
  Administered 2015-11-25 – 2015-11-29 (×5): 1 via ORAL
  Filled 2015-11-25 (×8): qty 1

## 2015-11-25 MED ORDER — LORAZEPAM 1 MG PO TABS
1.0000 mg | ORAL_TABLET | Freq: Two times a day (BID) | ORAL | Status: AC
  Administered 2015-11-27 – 2015-11-28 (×2): 1 mg via ORAL
  Filled 2015-11-25 (×2): qty 1

## 2015-11-25 MED ORDER — PAROXETINE HCL 20 MG PO TABS
20.0000 mg | ORAL_TABLET | Freq: Every day | ORAL | Status: DC
  Administered 2015-11-25: 20 mg via ORAL
  Filled 2015-11-25 (×3): qty 1

## 2015-11-25 MED ORDER — ACETAMINOPHEN 325 MG PO TABS
650.0000 mg | ORAL_TABLET | Freq: Four times a day (QID) | ORAL | Status: DC | PRN
  Administered 2015-11-25: 650 mg via ORAL
  Filled 2015-11-25: qty 2

## 2015-11-25 MED ORDER — LORAZEPAM 1 MG PO TABS
1.0000 mg | ORAL_TABLET | Freq: Four times a day (QID) | ORAL | Status: AC
  Administered 2015-11-25 – 2015-11-26 (×6): 1 mg via ORAL
  Filled 2015-11-25 (×6): qty 1

## 2015-11-25 MED ORDER — HYDROXYZINE HCL 25 MG PO TABS: 25.0000 mg | ORAL_TABLET | Freq: Four times a day (QID) | ORAL | Status: AC | PRN

## 2015-11-25 MED ORDER — GABAPENTIN 100 MG PO CAPS
200.0000 mg | ORAL_CAPSULE | Freq: Every day | ORAL | Status: DC
  Administered 2015-11-25: 200 mg via ORAL
  Filled 2015-11-25 (×3): qty 2

## 2015-11-25 MED ORDER — ONDANSETRON 4 MG PO TBDP
4.0000 mg | ORAL_TABLET | Freq: Four times a day (QID) | ORAL | Status: AC | PRN
  Administered 2015-11-25: 4 mg via ORAL
  Filled 2015-11-25: qty 1

## 2015-11-25 MED ORDER — VITAMIN B-1 100 MG PO TABS
100.0000 mg | ORAL_TABLET | Freq: Every day | ORAL | Status: DC
  Administered 2015-11-26 – 2015-11-29 (×4): 100 mg via ORAL
  Filled 2015-11-25 (×2): qty 1
  Filled 2015-11-25: qty 7
  Filled 2015-11-25 (×3): qty 1

## 2015-11-25 MED ORDER — MAGNESIUM HYDROXIDE 400 MG/5ML PO SUSP: 30.0000 mL | Freq: Every day | ORAL | Status: DC | PRN

## 2015-11-25 MED ORDER — MIRTAZAPINE 15 MG PO TABS
15.0000 mg | ORAL_TABLET | Freq: Every evening | ORAL | Status: DC | PRN
  Administered 2015-11-25 – 2015-11-28 (×4): 15 mg via ORAL
  Filled 2015-11-25: qty 14
  Filled 2015-11-25 (×5): qty 1
  Filled 2015-11-25: qty 14
  Filled 2015-11-25 (×6): qty 1

## 2015-11-25 MED ORDER — PANTOPRAZOLE SODIUM 40 MG PO TBEC
40.0000 mg | DELAYED_RELEASE_TABLET | Freq: Every day | ORAL | Status: DC
  Administered 2015-11-25 – 2015-11-29 (×5): 40 mg via ORAL
  Filled 2015-11-25 (×5): qty 1
  Filled 2015-11-25: qty 7
  Filled 2015-11-25 (×2): qty 1

## 2015-11-25 MED ORDER — IBUPROFEN 600 MG PO TABS
600.0000 mg | ORAL_TABLET | Freq: Four times a day (QID) | ORAL | Status: DC | PRN
  Administered 2015-11-25 (×2): 600 mg via ORAL
  Filled 2015-11-25 (×2): qty 1

## 2015-11-25 MED ORDER — PAROXETINE HCL 20 MG PO TABS
20.0000 mg | ORAL_TABLET | Freq: Every day | ORAL | Status: DC
  Administered 2015-11-25 – 2015-11-28 (×4): 20 mg via ORAL
  Filled 2015-11-25 (×6): qty 1

## 2015-11-25 MED ORDER — LORAZEPAM 1 MG PO TABS
1.0000 mg | ORAL_TABLET | Freq: Every day | ORAL | Status: AC
  Administered 2015-11-29: 1 mg via ORAL
  Filled 2015-11-25: qty 1

## 2015-11-25 MED ORDER — LORAZEPAM 1 MG PO TABS
1.0000 mg | ORAL_TABLET | Freq: Four times a day (QID) | ORAL | Status: AC | PRN
  Administered 2015-11-25 – 2015-11-26 (×2): 1 mg via ORAL
  Filled 2015-11-25 (×2): qty 1

## 2015-11-25 MED ORDER — LORAZEPAM 1 MG PO TABS
1.0000 mg | ORAL_TABLET | Freq: Three times a day (TID) | ORAL | Status: AC
  Administered 2015-11-26 – 2015-11-27 (×2): 1 mg via ORAL
  Filled 2015-11-25 (×2): qty 1

## 2015-11-25 MED ORDER — ALUM & MAG HYDROXIDE-SIMETH 200-200-20 MG/5ML PO SUSP: 30.0000 mL | ORAL | Status: DC | PRN

## 2015-11-25 MED ORDER — NICOTINE 21 MG/24HR TD PT24
21.0000 mg | MEDICATED_PATCH | Freq: Every day | TRANSDERMAL | Status: DC
  Administered 2015-11-25 – 2015-11-29 (×5): 21 mg via TRANSDERMAL
  Filled 2015-11-25 (×8): qty 1

## 2015-11-25 MED ORDER — AMLODIPINE BESYLATE 5 MG PO TABS
5.0000 mg | ORAL_TABLET | Freq: Every day | ORAL | Status: DC
  Administered 2015-11-25 – 2015-11-29 (×5): 5 mg via ORAL
  Filled 2015-11-25 (×5): qty 1
  Filled 2015-11-25: qty 7
  Filled 2015-11-25 (×2): qty 1

## 2015-11-25 MED ORDER — LOPERAMIDE HCL 2 MG PO CAPS: 2.0000 mg | ORAL_CAPSULE | ORAL | Status: AC | PRN

## 2015-11-25 MED ORDER — THIAMINE HCL 100 MG/ML IJ SOLN: 100.0000 mg | Freq: Once | INTRAMUSCULAR | Status: DC

## 2015-11-25 NOTE — Tx Team (Signed)
Initial Treatment Plan 11/25/2015 2:23 AM Melissa Mayer [id number]    PATIENT STRESSORS: Financial difficulties Marital or family conflict Substance abuse   PATIENT STRENGTHS: Ability for insight Average or above average intelligence Capable of independent living General fund of knowledge Motivation for treatment/growth   PATIENT IDENTIFIED PROBLEMS: Depression Suicidal thoughts Substance abuse "I'd say work on my alcohol"                     DISCHARGE CRITERIA:  Ability to meet basic life and health needs Improved stabilization in mood, thinking, and/or behavior Verbal commitment to aftercare and medication compliance Withdrawal symptoms are absent or subacute and managed without 24-hour nursing intervention  PRELIMINARY DISCHARGE PLAN: Attend aftercare/continuing care group Placement in alternative living arrangements  PATIENT/FAMILY INVOLVEMENT: This treatment plan has been presented to and reviewed with the patient, Melissa LevyDebra Fitzmaurice, and/or family member, .  The patient and family have been given the opportunity to ask questions and make suggestions.  Ajmal Kathan, [place], [place] 11/25/2015, 2:23 AM

## 2015-11-25 NOTE — Progress Notes (Signed)
Adult Psychoeducational Group Note  Date:  11/25/2015 Time:  8:44 PM  Group Topic/Focus:  Wrap-Up Group:   The focus of this group is to help patients review their daily goal of treatment and discuss progress on daily workbooks.   Participation Level:  Did Not Attend  Participation Quality:  Did not attend  Affect:  Did not attend  Cognitive:  Did not attend  Insight: None  Engagement in Group:  Did not attend  Modes of Intervention:  Did not attend  Additional Comments:  Patient did not attend wrap up group today.  Bentlee Benningfield L Sharetta Ricchio 11/25/2015, 8:44 PM
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
## 2015-11-25 NOTE — [facility] Suicide Risk Assessment (Signed)
[facility]   Nursing information obtained from:    Demographic factors:    Current Mental Status:    Loss Factors:    Historical Factors:    Risk Reduction Factors:     Total Time spent with patient: 1 hour Principal Problem: <principal problem not specified> Diagnosis:   Patient Active Problem List   Diagnosis Date Noted  . Alcohol-induced mood disorder ([facility]) [F10.94] 11/25/2015   Subjective Data: see admission assessment  Continued Clinical Symptoms:  Alcohol Use Disorder Identification Test Final Score (AUDIT): 30 The "Alcohol Use Disorders Identification Test", Guidelines for Use in Primary Care, Second Edition.  World Science writerHealth Organization [facility](WHO). Score between 0-7:  no or low risk or alcohol related problems. Score between 8-15:  moderate risk of alcohol related problems. Score between 16-19:  high risk of alcohol related problems. Score 20 or above:  warrants further diagnostic evaluation for alcohol dependence and treatment.   CLINICAL FACTORS:   Depression:   Comorbid alcohol abuse/dependence Hopelessness Impulsivity Insomnia Alcohol/Substance Abuse/Dependencies   Musculoskeletal: see admission assessment  Psychiatric Specialty Exam:see admission assessment Physical Exam  ROS  Blood pressure 121/74, pulse 83, temperature 98.1 F (36.7 C), temperature source Oral, resp. rate 18, height 5\' 4"  (1.626 m), weight 73 kg (161 lb), last menstrual period 09/16/2015.Body mass index is 27.64 kg/m.      COGNITIVE FEATURES THAT CONTRIBUTE TO RISK:  None    SUICIDE RISK:   Moderate:  Frequent suicidal ideation with limited intensity, and duration, some specificity in terms of plans, no associated intent, good self-control, limited dysphoria/symptomatology, some risk factors present, and identifiable protective factors, including available and accessible social support.   PLAN OF CARE: see admission assessment  I certify that inpatient  services furnished can reasonably be expected to improve the patient's condition.  Wynelle BourgeoisUreh N Lekauwa, MD 11/25/2015, 12:31 PM

## 2015-11-25 NOTE — Progress Notes (Signed)
DAR NOTE: Patient presents with sad, flat affect and depressed mood.  Patient currently denies suicidal ideation.  Denies auditory and visual hallucinations.  Rates depression at 10, hopelessness at 7, and anxiety at 10.  Described energy level as low and concentration as poor.  Reports poor night sleep.  Reports withdrawal symptoms of tremors, cramping, nausea and irritability.  Maintained on routine safety checks.  Medications given as prescribed.  Support and encouragement offered as needed. States goal for today is "to get treatment."  Patient remained in her room most of this shift.  Food and fluid intake encouraged due to poor appetite.  Tylenol 650 mg given for complain of headache with good effect.

## 2015-11-25 NOTE — Progress Notes (Signed)
Recreation Therapy Notes    Animal-Assisted Activity (AAA) Program Checklist/Progress Notes Patient Eligibility Criteria Checklist & Daily Group note for Rec TxIntervention  Date: 10.24.2017 Time: 2:45pm Location: 4 00 Hall Dayroom   AAA/T Program Assumption of Risk Form signed by Patient/ or Parent Legal Guardian Yes  Patient is free of allergies or sever asthma Yes  Patient reports no fear of animals Yes  Patient reports no history of cruelty to animals Yes  Patient understands his/her participation is voluntary Yes  Behavioral Response: Did not attend.   Melissa Mayer, LRT/CTRS       Molina Hollenback L 11/25/2015 3:04 PM

## 2015-11-25 NOTE — Progress Notes (Signed)
Adult Psychoeducational Group Note  Date:  11/25/2015 Time:  6:11 PM  Group Topic/Focus:  Recovery Goals:   The focus of this group is to identify appropriate goals for recovery and establish a plan to achieve them.   Participation Level:  Did Not Attend   Mickie Baillizabeth O Iwenekha 11/25/2015, 6:11 PM
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
## 2015-11-25 NOTE — [facility] Group Notes (Signed)
[facility] LCSW Group Therapy 11/25/2015  1:15 PM   Type of Therapy: Group Therapy  Participation Level: Did Not Attend. Patient invited to participate but declined.   Tae Vonada, MSW, LCSW Clinical Social Worker [facility] [phone]   

## 2015-11-26 NOTE — Plan of Care (Signed)
Problem: Medication: Goal: Compliance with prescribed medication regimen will improve Outcome: Progressing Pt has been compliant with medications tonight.    

## 2015-11-26 NOTE — [facility] Suicide Risk Assessment (Signed)
[facility] INPATIENT:  Family/Significant Other Suicide Prevention Education  Suicide Prevention Education:  Patient Refusal for Family/Significant Other Suicide Prevention Education: The patient Melissa LevyDebra Rockefeller has refused to provide written consent for family/significant other to be provided Family/Significant Other Suicide Prevention Education during admission and/or prior to discharge.  Physician notified. SPE reviewed with patient and brochure provided. Patient encouraged to return to hospital if having suicidal thoughts, patient verbalized his/her understanding and has no further questions at this time.   Griselle Rufer L Taya Ashbaugh 11/26/2015, 3:49 PM

## 2015-11-26 NOTE — Progress Notes (Signed)
D: Pt was in bed in her room upon initial approach.  Pt presents with blunted, depressed affect and depressed mood.  Her goal is to "get some rest."  Pt denies SI/HI, denies hallucinations, reports back pain of 9/10.  Pt has been in her room for the majority of the night and she did not attend evening group.  Pt has withdrawal symptoms of tremor, anxiety, and nausea.   A: Introduced self to pt.  Actively listened to pt and offered support and encouragement. Medications administered per order.  PRN medication administered for pain and nausea.  On-site provider notified that pt requests medication for sleep.  Remeron 15 mg PO was ordered and administered. R: Pt is safe on the unit.  Pt is compliant with medications.  Pt verbally contracts for safety.  Will continue to monitor and assess.
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
## 2015-11-26 NOTE — Progress Notes (Signed)
Recreation Therapy Notes  Date: 11/26/15 Time: 0930 Location: 300 [address] Dayroom  Group Topic: Stress Management  Goal Area(s) Addresses:  Patient will verbalize importance of using healthy stress management.  Patient will identify positive emotions associated with healthy stress management.   Intervention: Stress Management  Activity :  Peaceful Place.  LRT introduced the stress management technique of guided imagery.  LRT read script to engage patients in the technique.  Patients were to follow along as LRT read script.     Education:  Stress Management, Discharge Planning.   Education Outcome: Acknowledges edcuation/In group clarification offered/Needs additional education  Clinical Observations/Feedback:  Pt did not attend group.   Terrace Chiem, LRT/CTRS         Jamani Bearce A 11/26/2015 12:31 PM 

## 2015-11-26 NOTE — [facility] Group Notes (Addendum)
[facility] LCSW Group Therapy 11/26/2015  1:15 PM   Type of Therapy: Group Therapy  Participation Level: Did Not Attend. Patient invited to participate but declined.   Dmarco Baldus, MSW, LCSW Clinical Social Worker [facility] [phone]    

## 2015-11-26 NOTE — Tx Team (Signed)
Interdisciplinary Treatment and Diagnostic Plan Update  11/26/2015 Time of Session: 9:30am Melissa Mayer MRN: [id number]  Primary Diagnoses: Active Problems:   Alcohol-induced mood disorder (HCC)   Current Medications:  Current Facility-Administered Medications  Medication Dose Route Frequency Provider Last Rate Last Dose  . acetaminophen (TYLENOL) tablet 650 mg  650 mg Oral Q6H PRN Kerry Hough, PA-C   650 mg at 11/25/15 1726  . alum & mag hydroxide-simeth (MAALOX/MYLANTA) 200-200-20 MG/5ML suspension 30 mL  30 mL Oral Q4H PRN Kerry Hough, PA-C      . amLODipine (NORVASC) tablet 5 mg  5 mg Oral Daily Kerry Hough, PA-C   5 mg at 11/26/15 6962  . gabapentin (NEURONTIN) capsule 200 mg  200 mg Oral BID Molinda Bailiff, MD   200 mg at 11/26/15 9528  . hydrOXYzine (ATARAX/VISTARIL) tablet 25 mg  25 mg Oral TID PRN Kerry Hough, PA-C      . hydrOXYzine (ATARAX/VISTARIL) tablet 25 mg  25 mg Oral Q6H PRN Kerry Hough, PA-C      . ibuprofen (ADVIL,MOTRIN) tablet 600 mg  600 mg Oral Q6H PRN Kerry Hough, PA-C   600 mg at 11/25/15 2109  . loperamide (IMODIUM) capsule 2-4 mg  2-4 mg Oral PRN Kerry Hough, PA-C      . LORazepam (ATIVAN) tablet 1 mg  1 mg Oral Q6H PRN Kerry Hough, PA-C   1 mg at 11/25/15 0215  . LORazepam (ATIVAN) tablet 1 mg  1 mg Oral QID Kerry Hough, PA-C   1 mg at 11/26/15 4132   Followed by  . LORazepam (ATIVAN) tablet 1 mg  1 mg Oral TID Kerry Hough, PA-C       Followed by  . [START ON 11/27/2015] LORazepam (ATIVAN) tablet 1 mg  1 mg Oral BID Kerry Hough, PA-C       Followed by  . [START ON 11/29/2015] LORazepam (ATIVAN) tablet 1 mg  1 mg Oral Daily Spencer E Simon, PA-C      . magnesium hydroxide (MILK OF MAGNESIA) suspension 30 mL  30 mL Oral Daily PRN Kerry Hough, PA-C      . mirtazapine (REMERON) tablet 15 mg  15 mg Oral QHS,MR X 1 Spencer E Simon, PA-C   15 mg at 11/25/15 2150  . multivitamin with minerals tablet 1 tablet  1  tablet Oral Daily Kerry Hough, PA-C   1 tablet at 11/26/15 4401  . nicotine (NICODERM CQ - dosed in mg/24 hours) patch 21 mg  21 mg Transdermal Daily Kerry Hough, PA-C   21 mg at 11/26/15 0272  . ondansetron (ZOFRAN-ODT) disintegrating tablet 4 mg  4 mg Oral Q6H PRN Kerry Hough, PA-C   4 mg at 11/25/15 2112  . pantoprazole (PROTONIX) EC tablet 40 mg  40 mg Oral Daily Kerry Hough, PA-C   40 mg at 11/26/15 5366  . PARoxetine (PAXIL) tablet 20 mg  20 mg Oral Daily Molinda Bailiff, MD   20 mg at 11/26/15 4403  . thiamine (B-1) injection 100 mg  100 mg Intramuscular Once [employer], PA-C      . thiamine (VITAMIN B-1) tablet 100 mg  100 mg Oral Daily Kerry Hough, PA-C   100 mg at 11/26/15 4742   PTA Medications: Prescriptions Prior to Admission  Medication Sig Dispense Refill Last Dose  . amLODipine (NORVASC) 5 MG tablet Take 5 mg by mouth daily.  Past Week at Unknown time  . gabapentin (NEURONTIN) 100 MG capsule Take 200 mg by mouth at bedtime.    Past Week at Unknown time  . hydrOXYzine (VISTARIL) 25 MG capsule Take 25 mg by mouth 3 (three) times daily as needed (anxiety.).   Past Week at Unknown time  . omeprazole (PRILOSEC) 20 MG capsule Take 20 mg by mouth daily.   Past Week at Unknown time  . PARoxetine (PAXIL) 20 MG tablet Take 20 mg by mouth at bedtime.   Past Week at Unknown time  . chlordiazePOXIDE (LIBRIUM) 25 MG capsule 50mg  PO TID x 1D, then 25-50mg  PO BID X 1D, then 25-50mg  PO QD X 1D (Patient not taking: Reported on 11/25/2015) 10 capsule 0 Not Taking at Unknown time  . cyclobenzaprine (FLEXERIL) 10 MG tablet Take 1 tablet (10 mg total) by mouth 2 (two) times daily as needed for muscle spasms. (Patient not taking: Reported on 11/25/2015) 20 tablet 0 Not Taking at Unknown time  . meloxicam (MOBIC) 15 MG tablet Take 1 tablet (15 mg total) by mouth daily. (Patient not taking: Reported on 11/25/2015) 30 tablet 0 Not Taking at Unknown time  . meloxicam (MOBIC) 7.5 MG  tablet Take 1 tablet (7.5 mg total) by mouth daily. (Patient not taking: Reported on 11/25/2015) 30 tablet 0 Not Taking at Unknown time  . naproxen (NAPROSYN) 375 MG tablet Take 1 tablet (375 mg total) by mouth 2 (two) times daily. (Patient not taking: Reported on 11/25/2015) 20 tablet 0 Not Taking at Unknown time    Patient Stressors: Financial difficulties Marital or family conflict Substance abuse  Patient Strengths: Ability for insight Average or above average intelligence Capable of independent living [employer] of knowledge Motivation for treatment/growth  Treatment Modalities: Medication Management, Group therapy, Case management,  1 to 1 session with clinician, Psychoeducation, Recreational therapy.   Physician Treatment Plan for Primary Diagnosis:  Alcohol-induced mood disorder (HCC) Long Term Goal(s): Improvement in symptoms so as ready for discharge Improvement in symptoms so as ready for discharge   Short Term Goals: Ability to identify changes in lifestyle to reduce recurrence of condition will improve Ability to verbalize feelings will improve Ability to disclose and discuss suicidal ideas Ability to demonstrate self-control will improve Ability to identify and develop effective coping behaviors will improve Ability to maintain clinical measurements within normal limits will improve Compliance with prescribed medications will improve Ability to identify triggers associated with substance abuse/mental health issues will improve Ability to identify changes in lifestyle to reduce recurrence of condition will improve Ability to verbalize feelings will improve Ability to disclose and discuss suicidal ideas Ability to demonstrate self-control will improve Ability to identify and develop effective coping behaviors will improve Ability to maintain clinical measurements within normal limits will improve Compliance with prescribed medications will improve Ability to  identify triggers associated with substance abuse/mental health issues will improve  Medication Management: Evaluate patient's response, side effects, and tolerance of medication regimen.  Therapeutic Interventions: 1 to 1 sessions, Unit Group sessions and Medication administration.  Evaluation of Outcomes: Progressing   RN Treatment Plan for Primary Diagnosis:  Alcohol-induced mood disorder (HCC) Long Term Goal(s): Knowledge of disease and therapeutic regimen to maintain health will improve  Short Term Goals: Ability to remain free from injury will improve, Ability to disclose and discuss suicidal ideas, Ability to identify and develop effective coping behaviors will improve and Compliance with prescribed medications will improve  Medication Management: RN will administer medications as ordered by  provider, will assess and evaluate patient's response and provide education to patient for prescribed medication. RN will report any adverse and/or side effects to prescribing provider.  Therapeutic Interventions: 1 on 1 counseling sessions, Psychoeducation, Medication administration, Evaluate responses to treatment, Monitor vital signs and CBGs as ordered, Perform/monitor CIWA, COWS, AIMS and Fall Risk screenings as ordered, Perform wound care treatments as ordered.  Evaluation of Outcomes: Progressing   LCSW Treatment Plan for Primary Diagnosis:  Alcohol-induced mood disorder (HCC) Long Term Goal(s): Safe transition to appropriate next level of care at discharge, Engage patient in therapeutic group addressing interpersonal concerns.  Short Term Goals: Engage patient in aftercare planning with referrals and resources, Increase social support, Increase emotional regulation, Identify triggers associated with mental health/substance abuse issues and Increase skills for wellness and recovery  Therapeutic Interventions: Assess for all discharge needs, 1 to 1 time with Social worker, Explore available  resources and support systems, Assess for adequacy in community support network, Educate family and significant other(s) on suicide prevention, Complete Psychosocial Assessment, Interpersonal group therapy.  Evaluation of Outcomes: Progressing    Progress in Treatment :  Attending groups: Continuing to assess  Participating in groups: Continuing to assess  Taking medication as prescribed: Yes, MD continuing to assess for appropriate medication regimen  Toleration medication: Yes  Family/Significant other contact made: Treatment team assessing for appropriate contacts  Patient understands diagnosis: Yes  Discussing patient identified problems/goals with staff: Yes  Medical problems stabilized or resolved: Yes  Denies suicidal/homicidal ideation: Treatment team continuing to asses  Issues/concerns per patient self-inventory: None reported  Other: N/A  New problem(s) identified: None reported at this time    New Short Term/Long Term Goal(s): None at this time    Discharge Plan or Barriers: Treatment team continuing to assess.    Reason for Continuation of Hospitalization: Anxiety Depression Medication stabilization Suicidal Ideations Withdrawal symptoms  Estimated Length of Stay: 3-5 days    Attendees:  Patient:  Physician: Dr. Seth BakeLekwauwa, MD 10/25/20179:30am  Nursing: Marzetta Boardhrista Dopson, Joslyn Devonaroline Beaudry, Elby Beckoni Waller RN10/25/20179:30am  RN Care Manager: Onnie BoerJennifer Clark, CM 10/25/20179:30am  Social Workers: Samuella BruinKristin Murielle Stang, LCSW 10/25/20179:30am  Nurse Pratictioners: Trinity Bingonrad WithrowNP 10/25/20179:30am   Scribe for Treatment Team: Samuella BruinKristin Peretz Thieme, LCSW Clinical Social Worker [facility] [phone]

## 2015-11-26 NOTE — Progress Notes (Signed)
[facility] MD Progress Note  11/26/2015 12:40 PM Melissa Mayer  MRN:  [id number] Subjective:  Patient discussed in treatment team today and staff report still being tremulous. She is working with Education officer, museum to find a residential drug treatment or women;s shelter. Mrt with patient who continues to complain of anxiety , depressed mood  poor  Sleep and appetite but no suicidal ideation Principal Problem: <principal problem not specified> Diagnosis:  Alcohol Induced Mood Disorder Patient Active Problem List   Diagnosis Date Noted  . Alcohol-induced mood disorder ([place]) [F10.94] 11/25/2015   Total Time spent with patient: 30 minutes  Past Psychiatric History: see admission assessment  Past Medical History:  Past Medical History:  Diagnosis Date  . Anxiety   . Blood transfusion without reported diagnosis   . Depression   . Gastritis   . Heart abnormality    repiared 1996  . Hypertension     Past Surgical History:  Procedure Laterality Date  . CARDIAC SURGERY    . CHOLECYSTECTOMY     Family History: History reviewed. No pertinent family history. Family Psychiatric  History: see admission assessment Social History:  History  Alcohol Use  . Yes    Comment: "special occasion"     History  Drug Use No    Social History   Social History  . Marital status: Single    Spouse name: N/A  . Number of children: N/A  . Years of education: N/A   Social History Main Topics  . Smoking status: Current Every Day Smoker    Packs/day: 1.00    Types: Cigarettes  . Smokeless tobacco: Never Used  . Alcohol use Yes     Comment: "special occasion"  . Drug use: No  . Sexual activity: Not Currently   Other Topics Concern  . None   Social History Narrative  . None   Additional Social History:                         Sleep: disrupted  Appetite:  Poor  Current Medications: Current Facility-Administered Medications  Medication Dose Route Frequency Provider Last Rate Last  Dose  . acetaminophen (TYLENOL) tablet 650 mg  650 mg Oral Q6H PRN Laverle Hobby, PA-C   650 mg at 11/25/15 1726  . alum & mag hydroxide-simeth (MAALOX/MYLANTA) 200-200-20 MG/5ML suspension 30 mL  30 mL Oral Q4H PRN Laverle Hobby, PA-C      . amLODipine (NORVASC) tablet 5 mg  5 mg Oral Daily Laverle Hobby, PA-C   5 mg at 11/26/15 2119  . gabapentin (NEURONTIN) capsule 200 mg  200 mg Oral BID Sueanne Margarita, MD   200 mg at 11/26/15 4174  . hydrOXYzine (ATARAX/VISTARIL) tablet 25 mg  25 mg Oral TID PRN Laverle Hobby, PA-C      . hydrOXYzine (ATARAX/VISTARIL) tablet 25 mg  25 mg Oral Q6H PRN Laverle Hobby, PA-C      . ibuprofen (ADVIL,MOTRIN) tablet 600 mg  600 mg Oral Q6H PRN Laverle Hobby, PA-C   600 mg at 11/25/15 2109  . loperamide (IMODIUM) capsule 2-4 mg  2-4 mg Oral PRN Laverle Hobby, PA-C      . LORazepam (ATIVAN) tablet 1 mg  1 mg Oral Q6H PRN Laverle Hobby, PA-C   1 mg at 11/25/15 0215  . LORazepam (ATIVAN) tablet 1 mg  1 mg Oral TID Laverle Hobby, PA-C       Followed by  . [  START ON 11/27/2015] LORazepam (ATIVAN) tablet 1 mg  1 mg Oral BID Laverle Hobby, PA-C       Followed by  . [START ON 11/29/2015] LORazepam (ATIVAN) tablet 1 mg  1 mg Oral Daily Spencer E Simon, PA-C      . magnesium hydroxide (MILK OF MAGNESIA) suspension 30 mL  30 mL Oral Daily PRN Laverle Hobby, PA-C      . mirtazapine (REMERON) tablet 15 mg  15 mg Oral QHS,MR X 1 Spencer E Simon, PA-C   15 mg at 11/25/15 2150  . multivitamin with minerals tablet 1 tablet  1 tablet Oral Daily Laverle Hobby, PA-C   1 tablet at 11/26/15 0254  . nicotine (NICODERM CQ - dosed in mg/24 hours) patch 21 mg  21 mg Transdermal Daily Laverle Hobby, PA-C   21 mg at 11/26/15 2706  . ondansetron (ZOFRAN-ODT) disintegrating tablet 4 mg  4 mg Oral Q6H PRN Laverle Hobby, PA-C   4 mg at 11/25/15 2112  . pantoprazole (PROTONIX) EC tablet 40 mg  40 mg Oral Daily Laverle Hobby, PA-C   40 mg at 11/26/15 2376  . PARoxetine  (PAXIL) tablet 20 mg  20 mg Oral Daily Sueanne Margarita, MD   20 mg at 11/26/15 2831  . thiamine (B-1) injection 100 mg  100 mg Intramuscular Once Spencer E Simon, PA-C      . thiamine (VITAMIN B-1) tablet 100 mg  100 mg Oral Daily Laverle Hobby, PA-C   100 mg at 11/26/15 5176    Lab Results:  Results for orders placed or performed during the hospital encounter of 11/24/15 (from the past 48 hour(s))  Urine rapid drug screen (hosp performed)not at [facility]     Status: Abnormal   Collection Time: 11/24/15  5:39 PM  Result Value Ref Range   Opiates NONE DETECTED NONE DETECTED   Cocaine NONE DETECTED NONE DETECTED   Benzodiazepines NONE DETECTED NONE DETECTED   Amphetamines NONE DETECTED NONE DETECTED   Tetrahydrocannabinol POSITIVE (A) NONE DETECTED   Barbiturates NONE DETECTED NONE DETECTED    Comment:        DRUG SCREEN FOR MEDICAL PURPOSES ONLY.  IF CONFIRMATION IS NEEDED FOR ANY PURPOSE, NOTIFY LAB WITHIN 5 DAYS.        LOWEST DETECTABLE LIMITS FOR URINE DRUG SCREEN Drug Class       Cutoff (ng/mL) Amphetamine      1000 Barbiturate      200 Benzodiazepine   160 Tricyclics       737 Opiates          300 Cocaine          300 THC              50   Urinalysis, Routine w reflex microscopic (not at [facility])     Status: Abnormal   Collection Time: 11/24/15  5:39 PM  Result Value Ref Range   Color, Urine YELLOW YELLOW   APPearance CLOUDY (A) CLEAR   Specific Gravity, Urine 1.004 (L) 1.005 - 1.030   pH 6.0 5.0 - 8.0   Glucose, UA NEGATIVE NEGATIVE mg/dL   Hgb urine dipstick NEGATIVE NEGATIVE   Bilirubin Urine NEGATIVE NEGATIVE   Ketones, ur NEGATIVE NEGATIVE mg/dL   Protein, ur NEGATIVE NEGATIVE mg/dL   Nitrite NEGATIVE NEGATIVE   Leukocytes, UA TRACE (A) NEGATIVE  Urine microscopic-add on     Status: Abnormal   Collection Time: 11/24/15  5:39 PM  Result Value Ref Range   Squamous Epithelial / LPF 6-30 (A) NONE SEEN   WBC, UA 0-5 0 - 5 WBC/hpf   RBC / HPF NONE SEEN 0 - 5 RBC/hpf    Bacteria, UA FEW (A) NONE SEEN  Comprehensive metabolic panel     Status: Abnormal   Collection Time: 11/24/15  5:49 PM  Result Value Ref Range   Sodium 140 135 - 145 mmol/L   Potassium 3.2 (L) 3.5 - 5.1 mmol/L   Chloride 106 101 - 111 mmol/L   CO2 22 22 - 32 mmol/L   Glucose, Bld 107 (H) 65 - 99 mg/dL   BUN 5 (L) 6 - 20 mg/dL   Creatinine, Ser 0.73 0.44 - 1.00 mg/dL   Calcium 9.5 8.9 - 10.3 mg/dL   Total Protein 7.7 6.5 - 8.1 g/dL   Albumin 4.4 3.5 - 5.0 g/dL   AST 32 15 - 41 U/L   ALT 38 14 - 54 U/L   Alkaline Phosphatase 138 (H) 38 - 126 U/L   Total Bilirubin 0.6 0.3 - 1.2 mg/dL   GFR calc non Af Amer >60 >60 mL/min   GFR calc Af Amer >60 >60 mL/min    Comment: (NOTE) The eGFR has been calculated using the CKD EPI equation. This calculation has not been validated in all clinical situations. eGFR's persistently <60 mL/min signify possible Chronic Kidney Disease.    Anion gap 12 5 - 15  Ethanol     Status: Abnormal   Collection Time: 11/24/15  5:49 PM  Result Value Ref Range   Alcohol, Ethyl (B) 264 (H) <5 mg/dL    Comment:        LOWEST DETECTABLE LIMIT FOR SERUM ALCOHOL IS 5 mg/dL FOR MEDICAL PURPOSES ONLY   CBC with Diff     Status: Abnormal   Collection Time: 11/24/15  5:49 PM  Result Value Ref Range   WBC 5.9 4.0 - 10.5 K/uL   RBC 4.47 3.87 - 5.11 MIL/uL   Hemoglobin 15.6 (H) 12.0 - 15.0 g/dL   HCT 44.6 36.0 - 46.0 %   MCV 99.8 78.0 - 100.0 fL   MCH 34.9 (H) 26.0 - 34.0 pg   MCHC 35.0 30.0 - 36.0 g/dL   RDW 14.6 11.5 - 15.5 %   Platelets 254 150 - 400 K/uL   Neutrophils Relative % 42 %   Neutro Abs 2.5 1.7 - 7.7 K/uL   Lymphocytes Relative 46 %   Lymphs Abs 2.8 0.7 - 4.0 K/uL   Monocytes Relative 7 %   Monocytes Absolute 0.4 0.1 - 1.0 K/uL   Eosinophils Relative 4 %   Eosinophils Absolute 0.2 0.0 - 0.7 K/uL   Basophils Relative 1 %   Basophils Absolute 0.0 0.0 - 0.1 K/uL  Acetaminophen level     Status: Abnormal   Collection Time: 11/24/15  5:49 PM   Result Value Ref Range   Acetaminophen (Tylenol), Serum <10 (L) 10 - 30 ug/mL    Comment:        THERAPEUTIC CONCENTRATIONS VARY SIGNIFICANTLY. A RANGE OF 10-30 ug/mL MAY BE AN EFFECTIVE CONCENTRATION FOR MANY PATIENTS. HOWEVER, SOME ARE BEST TREATED AT CONCENTRATIONS OUTSIDE THIS RANGE. ACETAMINOPHEN CONCENTRATIONS >150 ug/mL AT 4 HOURS AFTER INGESTION AND >50 ug/mL AT 12 HOURS AFTER INGESTION ARE OFTEN ASSOCIATED WITH TOXIC REACTIONS.   Salicylate level     Status: None   Collection Time: 11/24/15  5:49 PM  Result Value Ref Range   Salicylate Lvl <4.7  2.8 - 30.0 mg/dL    Blood Alcohol level:  Lab Results  Component Value Date   ETH 264 (H) 35/00/9381    Metabolic Disorder Labs: No results found for: HGBA1C, MPG No results found for: PROLACTIN No results found for: CHOL, TRIG, HDL, CHOLHDL, VLDL, LDLCALC  Physical Findings: AIMS: Facial and Oral Movements Muscles of Facial Expression: None, normal Lips and Perioral Area: None, normal Jaw: None, normal Tongue: None, normal,Extremity Movements Upper (arms, wrists, hands, fingers): None, normal Lower (legs, knees, ankles, toes): None, normal, Trunk Movements Neck, shoulders, hips: None, normal, Overall Severity Severity of abnormal movements (highest score from questions above): None, normal Incapacitation due to abnormal movements: None, normal Patient's awareness of abnormal movements (rate only patient's report): No Awareness, Dental Status Current problems with teeth and/or dentures?: No Does patient usually wear dentures?: No  CIWA:  CIWA-Ar Total: 4 COWS:     Musculoskeletal: Strength & Muscle Tone: within normal limits Gait & Station: normal Patient leans: N/A  Psychiatric Specialty Exam: Physical Exam  ROS  Blood pressure (!) 131/100, pulse 93, temperature 98.2 F (36.8 C), temperature source Oral, resp. rate 16, height _0  (1.626 m), weight 73 kg (161 lb), last menstrual period 09/16/2015.Body  mass index is 27.64 kg/m.  General Appearance: Casual  Eye Contact:  Fair  Speech:  Clear and Coherent and Normal Rate  Volume:  Normal  Mood:  Anxious  Affect:  Congruent  Thought Process:  Coherent and Goal Directed  Orientation:  Full (Time, Place, and Person)  Thought Content:  Logical  Suicidal Thoughts:  No  Homicidal Thoughts:  No  Memory:  Immediate;   Fair Recent;   Fair Remote;   Fair  Judgement:  Fair  Insight:  Fair  Psychomotor Activity:  Normal  Concentration:  Concentration: Fair and Attention Span: Fair  Recall:  [employer] of Knowledge:  Fair  Language:  Fair  Akathisia:  No  Handed:  Right  AIMS (if indicated):     Assets:  Communication Skills Desire for Improvement  ADL's:  Intact  Cognition:  WNL  Sleep:  Number of Hours: 6.75     Treatment Plan Summary: Daily contact with patient to assess and evaluate symptoms and progress in treatment and Medication management  Ruffin Frederick, MD 11/26/2015, 12:40 PM

## 2015-11-26 NOTE — Plan of Care (Signed)
Problem: Activity: Goal: Interest or engagement in activities will improve Outcome: Not Progressing Patient is isolative and withdrawn.

## 2015-11-26 NOTE — Progress Notes (Signed)
D: Patient has been in bed majority of morning.  She is tremulous and had difficulty taking her medications because of shakiness.  Patient states she has detoxed from alcohol in the past.  She states, "I was drinking every day." She presents with flat, blunted affect; her mood is depressed and anxious.  She denies any thoughts of self harm; HI/AVH. A: Continue to monitor MD orders and withdrawal symptoms.  Safety checks continued every 15 minutes per protocol.  Offer support and encouragement as needed. R: Patient remains isolative and withdrawn.

## 2015-11-27 NOTE — [facility] Group Notes (Signed)
Nursing orientation note:  Patient did not attend.

## 2015-11-27 NOTE — Progress Notes (Signed)
D: Pt was in bed in her room upon initial approach.  Pt presents with depressed affect and mood.  She reports her day was "not good, I'm feeling like shit."  Her goal is to "get rid of the withdrawal symptoms."  Pt denies SI/HI, denies hallucinations, denies pain.  Pt has stayed in bed in her room for the majority of the night.  She reports withdrawal symptoms of tremors, sweats, anxiety. A: Actively listened to pt and offered support and encouragement. Medications administered per order.  PRN medication administered for withdrawal symptoms. R: Pt is safe on the unit.  Pt is compliant with medications.  Pt verbally contracts for safety.  Will continue to monitor and assess.

## 2015-11-27 NOTE — [facility] Group Notes (Signed)
Type of Therapy: Mental Health Association Presentation  Participation Level: Active  Participation Quality: Attentive  Affect: Appropriate  Cognitive: Oriented  Insight: Developing/Improving  Engagement in Therapy: Engaged  Modes of Intervention: Discussion, Education and Socialization  Summary of Progress/Problems: Mental Health Association (MHA) Speaker came to talk about his personal journey with substance abuse and addiction. The pt processed ways by which to relate to the speaker. MHA speaker provided handouts and educational information pertaining to groups and services offered by the [facility]. Invited, chose not to attend.

## 2015-11-27 NOTE — Progress Notes (Signed)
D: Patient continues to be anxious and depressed.  She continues to be tremulous.  Patient was encouraged to go to the cafeteria.  She refused breakfast this morning.  She states, "I just can't deal with other people."  Explained to patient that going to the cafeteria is part of her treatment and she has been isolative to her room.  Patient seems to get irritable at that point and walked away from the med window.  She denies any thoughts of self harm; AVH/HI.  She refuses to fill out her self inventory sheet. A: Continue to monitor medication management and MD orders.  Safety checks completed every 15 minutes per protocol.  Offer support and encouragement as needed. R: Patient is receptive to staff; her behavior is appropriate.

## 2015-11-27 NOTE — Progress Notes (Signed)
[facility] MD Progress Note  11/27/2015 2:56 PM Melissa Mayer  MRN:  [id number] Subjective:  Patient discussed in treatment team today and staff report  Tremulous and she remains on ativan detox. Patient had ativan last night.Patient remains isolative, refusing to go to the cafeteria because it makes her anxious to be with others. She has been eating snacks.She is working with Education officer, museum to find a residential drug treatment or women;s shelter. Met with patient who continues to complain of anxiety , depressed mood  poor  Sleep and appetite but no suicidal ideation Principal Problem: <principal problem not specified> Diagnosis:  Alcohol Induced Mood Disorder Patient Active Problem List   Diagnosis Date Noted  . Alcohol-induced mood disorder ([place]) [F10.94] 11/25/2015   Total Time spent with patient: 30 minutes  Past Psychiatric History: see admission assessment  Past Medical History:  Past Medical History:  Diagnosis Date  . Anxiety   . Blood transfusion without reported diagnosis   . Depression   . Gastritis   . Heart abnormality    repiared 1996  . Hypertension     Past Surgical History:  Procedure Laterality Date  . CARDIAC SURGERY    . CHOLECYSTECTOMY     Family History: History reviewed. No pertinent family history. Family Psychiatric  History: see admission assessment Social History:  History  Alcohol Use  . Yes    Comment: "special occasion"     History  Drug Use No    Social History   Social History  . Marital status: Single    Spouse name: N/A  . Number of children: N/A  . Years of education: N/A   Social History Main Topics  . Smoking status: Current Every Day Smoker    Packs/day: 1.00    Types: Cigarettes  . Smokeless tobacco: Never Used  . Alcohol use Yes     Comment: "special occasion"  . Drug use: No  . Sexual activity: Not Currently   Other Topics Concern  . None   Social History Narrative  . None   Additional Social History:                          Sleep: disrupted  Appetite:  Poor  Current Medications: Current Facility-Administered Medications  Medication Dose Route Frequency Provider Last Rate Last Dose  . acetaminophen (TYLENOL) tablet 650 mg  650 mg Oral Q6H PRN Laverle Hobby, PA-C   650 mg at 11/25/15 1726  . alum & mag hydroxide-simeth (MAALOX/MYLANTA) 200-200-20 MG/5ML suspension 30 mL  30 mL Oral Q4H PRN Laverle Hobby, PA-C      . amLODipine (NORVASC) tablet 5 mg  5 mg Oral Daily Laverle Hobby, PA-C   5 mg at 11/27/15 1059  . gabapentin (NEURONTIN) capsule 200 mg  200 mg Oral BID Sueanne Margarita, MD   200 mg at 11/27/15 1100  . hydrOXYzine (ATARAX/VISTARIL) tablet 25 mg  25 mg Oral Q6H PRN Laverle Hobby, PA-C      . ibuprofen (ADVIL,MOTRIN) tablet 600 mg  600 mg Oral Q6H PRN Laverle Hobby, PA-C   600 mg at 11/25/15 2109  . loperamide (IMODIUM) capsule 2-4 mg  2-4 mg Oral PRN Laverle Hobby, PA-C      . LORazepam (ATIVAN) tablet 1 mg  1 mg Oral Q6H PRN Laverle Hobby, PA-C   1 mg at 11/26/15 2135  . LORazepam (ATIVAN) tablet 1 mg  1 mg Oral TID Maurine Minister  Simon, PA-C   1 mg at 11/27/15 1100   Followed by  . LORazepam (ATIVAN) tablet 1 mg  1 mg Oral BID Laverle Hobby, PA-C       Followed by  . [START ON 11/29/2015] LORazepam (ATIVAN) tablet 1 mg  1 mg Oral Daily Spencer E Simon, PA-C      . magnesium hydroxide (MILK OF MAGNESIA) suspension 30 mL  30 mL Oral Daily PRN Laverle Hobby, PA-C      . mirtazapine (REMERON) tablet 15 mg  15 mg Oral QHS,MR X 1 Laverle Hobby, PA-C   15 mg at 11/26/15 2135  . multivitamin with minerals tablet 1 tablet  1 tablet Oral Daily Laverle Hobby, PA-C   1 tablet at 11/27/15 1100  . nicotine (NICODERM CQ - dosed in mg/24 hours) patch 21 mg  21 mg Transdermal Daily Laverle Hobby, PA-C   21 mg at 11/27/15 1102  . ondansetron (ZOFRAN-ODT) disintegrating tablet 4 mg  4 mg Oral Q6H PRN Laverle Hobby, PA-C   4 mg at 11/25/15 2112  . pantoprazole (PROTONIX) EC tablet  40 mg  40 mg Oral Daily Laverle Hobby, PA-C   40 mg at 11/27/15 1101  . PARoxetine (PAXIL) tablet 20 mg  20 mg Oral Daily Sueanne Margarita, MD   20 mg at 11/27/15 1059  . thiamine (B-1) injection 100 mg  100 mg Intramuscular Once [employer], PA-C      . thiamine (VITAMIN B-1) tablet 100 mg  100 mg Oral Daily Laverle Hobby, PA-C   100 mg at 11/27/15 1059    Lab Results:  No results found for this or any previous visit (from the past 48 hour(s)).  Blood Alcohol level:  Lab Results  Component Value Date   ETH 264 (H) 72/53/6644    Metabolic Disorder Labs: No results found for: HGBA1C, MPG No results found for: PROLACTIN No results found for: CHOL, TRIG, HDL, CHOLHDL, VLDL, LDLCALC  Physical Findings: AIMS: Facial and Oral Movements Muscles of Facial Expression: None, normal Lips and Perioral Area: None, normal Jaw: None, normal Tongue: None, normal,Extremity Movements Upper (arms, wrists, hands, fingers): None, normal Lower (legs, knees, ankles, toes): None, normal, Trunk Movements Neck, shoulders, hips: None, normal, Overall Severity Severity of abnormal movements (highest score from questions above): None, normal Incapacitation due to abnormal movements: None, normal Patient's awareness of abnormal movements (rate only patient's report): No Awareness, Dental Status Current problems with teeth and/or dentures?: No Does patient usually wear dentures?: No  CIWA:  CIWA-Ar Total: 3 COWS:     Musculoskeletal: Strength & Muscle Tone: within normal limits Gait & Station: normal Patient leans: N/A  Psychiatric Specialty Exam: Physical Exam  ROS  Blood pressure (!) 144/96, pulse 97, temperature 98.5 F (36.9 C), temperature source Oral, resp. rate 16, height '5\' 4"'  (1.626 m), weight 73 kg (161 lb), last menstrual period 09/16/2015.Body mass index is 27.64 kg/m.  General Appearance: Casual  Eye Contact:  Fair  Speech:  Clear and Coherent and Normal Rate  Volume:  Normal   Mood:  Anxious  Affect:  Congruent  Thought Process:  Coherent and Goal Directed  Orientation:  Full (Time, Place, and Person)  Thought Content:  Logical  Suicidal Thoughts:  No  Homicidal Thoughts:  No  Memory:  Immediate;   Fair Recent;   Fair Remote;   Fair  Judgement:  Fair  Insight:  Fair  Psychomotor Activity:  Normal  Concentration:  Concentration: Fair and Attention Span: Fair  Recall:  [employer] of Knowledge:  Fair  Language:  Fair  Akathisia:  No  Handed:  Right  AIMS (if indicated):     Assets:  Communication Skills Desire for Improvement  ADL's:  Intact  Cognition:  WNL  Sleep:  Number of Hours: 6.25     Treatment Plan Summary: Daily contact with patient to assess and evaluate symptoms and progress in treatment and Medication management  Ruffin Frederick, MD 11/27/2015, 2:56 PM

## 2015-11-28 MED ORDER — PAROXETINE HCL 10 MG PO TABS
30.0000 mg | ORAL_TABLET | Freq: Every day | ORAL | Status: DC
  Administered 2015-11-29: 30 mg via ORAL
  Filled 2015-11-28: qty 21
  Filled 2015-11-28 (×2): qty 3

## 2015-11-28 NOTE — Progress Notes (Signed)
Recreation Therapy Notes  Date: 11/28/15 Time: 0930 Location: 300 [address] Dayroom  Group Topic: Stress Management  Goal Area(s) Addresses:  Patient will verbalize importance of using healthy stress management.  Patient will identify positive emotions associated with healthy stress management.   Intervention: Stress Management  Activity :  [place].  LRT introduced the stress management technique of guided imagery.  LRT read Mayer script to engage patients in the technique.  Patients were to follow along as LRT read the script.  Education:  Stress Management, Discharge Planning.   Education Outcome: Acknowledges edcuation/In group clarification offered/Needs additional education  Clinical Observations/Feedback: Pt did not attend group.     Firmin Belisle, LRT/CTRS         Melissa Mayer 11/28/2015 11:34 AM 

## 2015-11-28 NOTE — Progress Notes (Signed)
  [facility] Discharge Plan :  Will you be returning to the same living situation after discharge:  No. Patient provided with listing of shelters At discharge, do you have transportation home?: Yes,  bus pass provided Do you have the ability to pay for your medications: Yes,  patient will be provided with prescriptions at discharge  Release of information consent forms completed and in the chart;  Patient's signature needed at discharge.  Patient to Follow up at: Follow-up Information    [facility] .   Specialty:  Professional Counselor Why:  Please go to walk-in clinic within 7 days of discharge Monday-Friday at between 8am to 3pm to get set back up with therapist or medication management services.  Contact information: [address] [place] [id number] [phone]           Next level of care provider has [facility] Link:no  Safety Planning and Suicide Prevention discussed: Yes,  with patient  Have you used any form of tobacco in the last 30 days? (Cigarettes, Smokeless Tobacco, Cigars, and/or Pipes): Yes  Has patient been referred to the Quitline?: Patient refused referral  Patient has been referred for addiction treatment: Yes  Melissa Mayer 11/28/2015, 4:31 PM

## 2015-11-28 NOTE — [facility] Group Notes (Signed)
[facility] LCSW Group Therapy 11/28/2015  1:15 PM   Type of Therapy: Group Therapy  Participation Level: Did Not Attend. Patient invited to participate but declined.   Samuella BruinKristin Daison Braxton, MSW, LCSW Clinical Social Worker [facility] [phone]

## 2015-11-28 NOTE — Progress Notes (Signed)
Pt did not attend wrap-up group   

## 2015-11-28 NOTE — Progress Notes (Signed)
Patient ID: [name], female   DOB: 07-Feb-1965, 50 y.o.   MRN: [id number] D: Client in room this shift, Client reports anxiety "7" of 10.  Client reports this admission has been helpful "got my mind straight" "getting sober helped" Client reports she has a safe place to go and has a friend who is support "she doesn't drink" A:Writer provided emotional support encouraged client to follow through with recovery. Medications reviewed, administered as ordered. Staff will monitor q4615min for safety. R: Client is safe on the unit, did not attend group.

## 2015-11-28 NOTE — Progress Notes (Signed)
D: Pt was in bed in her room upon initial approach.  Pt presents with depressed affect and mood.  She reports her day was "okay" and her goal is to "go home."  Pt remains isolative to her room tonight and did not attend evening group.  Pt denies SI/HI, denies hallucinations, denies pain.   A: Actively listened to pt and offered support and encouragement. Medication administered per order.   R: Pt is safe on the unit.  Pt is compliant with medication.  Pt verbally contracts for safety.  Will continue to monitor and assess.
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
## 2015-11-28 NOTE — Progress Notes (Signed)
DAR NOTE: Patient presents with depressed affect and mood.  Denies pain, auditory and visual hallucinations.  Rates depression at 5, hopelessness at 7, and anxiety at 7.  Maintained on routine safety checks.  Medications given as prescribed.  Support and encouragement offered as needed.  States goal for today is "getting out."  Patient remained withdrawn and isolative.  Patient encouraged to participate in therapy and activities.  Refused to go to [employer] for meals.  Patient states her anxiety level goes up whenever she is around people.  Patient reminded of the importance of attending group therapy to learn effective coping skills.

## 2015-11-28 NOTE — Plan of Care (Signed)
Problem: Activity: Goal: Sleeping patterns will improve Outcome: Progressing Pt slept 6.25 hours last night according to flowsheet.    

## 2015-11-29 DIAGNOSIS — F17210 Nicotine dependence, cigarettes, uncomplicated: Secondary | ICD-10-CM

## 2015-11-29 DIAGNOSIS — F1094 Alcohol use, unspecified with alcohol-induced mood disorder: Secondary | ICD-10-CM

## 2015-11-29 MED ORDER — NICOTINE 21 MG/24HR TD PT24: 21.0000 mg | MEDICATED_PATCH | Freq: Every day | TRANSDERMAL | 0 refills | Status: AC

## 2015-11-29 MED ORDER — MIRTAZAPINE 15 MG PO TABS: 15.0000 mg | ORAL_TABLET | Freq: Every evening | ORAL | 0 refills | Status: AC | PRN

## 2015-11-29 MED ORDER — PAROXETINE HCL 30 MG PO TABS: 30.0000 mg | ORAL_TABLET | Freq: Every day | ORAL | 0 refills | Status: AC

## 2015-11-29 MED ORDER — GABAPENTIN 100 MG PO CAPS: 200.0000 mg | ORAL_CAPSULE | Freq: Two times a day (BID) | ORAL | 0 refills | Status: AC
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
## 2015-11-29 NOTE — [facility] Group Notes (Signed)
[facility] Group Notes:  (Nursing/MHT/Case Management/Adjunct)  Date:  11/29/2015  Time:  11:19 AM  Type of Therapy:  Psychoeducational Skills  Participation Level:  Did Not Attend  Participation Quality:  Did Not Attend  Affect:  Did Not Attend  Cognitive:  Did Not Attend  Insight:  None  Engagement in Group:  Did Not Attend  Modes of Intervention:  Did Not Attend  Summary of Progress/Problems: Pt did not attend patient self inventory group.   Melissa Mayer 11/29/2015, 11:19 AM 

## 2015-11-29 NOTE — Plan of Care (Signed)
Problem: Safety: Goal: Ability to remain free from injury will improve Outcome: Progressing D: client is safe on the unit AEB [id number] safety checks, exhibiting no s/s of withdrawal. CIWA of "0".
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
## 2015-11-29 NOTE — Progress Notes (Signed)
Patient ID: Melissa LevyDebra Burkes, female   DOB: 1965-05-17, 50 y.o.   MRN: [id number]   Pt was discharged home with her boyfriend. Pt reported that she was ready for discharge and that she brought herself in but was now ready to go. Pt reported that her depression was a 5, her hopelessness was a 4, and her anxiety was a 9. Pt reported that she was negative SI/HI, no AH/VH noted. Pt reported that her goal for today was to work on going home. Pt was given all discharge instructions and sample medications.

## 2015-11-29 NOTE — [facility] Group Notes (Signed)
[facility] Group Notes: (Clinical Social Work)   11/29/2015      Type of Therapy:  Group Therapy   Participation Level:  Did Not Attend despite MHT prompting   Ambrose MantleMareida Grossman-Orr, LCSW 11/29/2015, 12:12 PM
